# Patient Record
Sex: Female | Born: 1975 | Race: White | Hispanic: No | Marital: Married | Smoking: Never smoker
Health system: Southern US, Community
[De-identification: ages and names within clinical notes are randomized; demographics above are authoritative.]

## PROBLEM LIST (undated history)

## (undated) ENCOUNTER — Inpatient Hospital Stay (HOSPITAL_COMMUNITY): Payer: Self-pay

## (undated) DIAGNOSIS — O24419 Gestational diabetes mellitus in pregnancy, unspecified control: Secondary | ICD-10-CM

## (undated) DIAGNOSIS — O139 Gestational [pregnancy-induced] hypertension without significant proteinuria, unspecified trimester: Secondary | ICD-10-CM

## (undated) DIAGNOSIS — N83209 Unspecified ovarian cyst, unspecified side: Secondary | ICD-10-CM

## (undated) HISTORY — DX: Gestational diabetes mellitus in pregnancy, unspecified control: O24.419

## (undated) HISTORY — PX: LAPAROSCOPIC SALPINGOOPHERECTOMY: SUR795

## (undated) HISTORY — PX: LAPAROSCOPIC OVARIAN CYSTECTOMY: SUR786

---

## 1994-02-26 HISTORY — PX: LAPAROSCOPY: SHX197

## 1997-08-22 ENCOUNTER — Inpatient Hospital Stay (HOSPITAL_COMMUNITY): Admission: AD | Admit: 1997-08-22 | Discharge: 1997-08-25 | Payer: Self-pay | Admitting: Obstetrics and Gynecology

## 1997-09-20 ENCOUNTER — Other Ambulatory Visit: Admission: RE | Admit: 1997-09-20 | Discharge: 1997-09-20 | Payer: Self-pay | Admitting: Obstetrics and Gynecology

## 1999-10-25 ENCOUNTER — Encounter (INDEPENDENT_AMBULATORY_CARE_PROVIDER_SITE_OTHER): Payer: Self-pay

## 1999-10-25 ENCOUNTER — Ambulatory Visit (HOSPITAL_COMMUNITY): Admission: RE | Admit: 1999-10-25 | Discharge: 1999-10-25 | Payer: Self-pay | Admitting: Gynecology

## 2000-07-10 ENCOUNTER — Encounter: Admission: RE | Admit: 2000-07-10 | Discharge: 2000-10-08 | Payer: Self-pay | Admitting: Gynecology

## 2000-09-13 ENCOUNTER — Inpatient Hospital Stay (HOSPITAL_COMMUNITY): Admission: AD | Admit: 2000-09-13 | Discharge: 2000-09-15 | Payer: Self-pay | Admitting: *Deleted

## 2001-11-18 ENCOUNTER — Other Ambulatory Visit: Admission: RE | Admit: 2001-11-18 | Discharge: 2001-11-18 | Payer: Self-pay | Admitting: Gynecology

## 2002-12-09 ENCOUNTER — Other Ambulatory Visit: Admission: RE | Admit: 2002-12-09 | Discharge: 2002-12-09 | Payer: Self-pay | Admitting: Gynecology

## 2004-03-10 ENCOUNTER — Other Ambulatory Visit: Admission: RE | Admit: 2004-03-10 | Discharge: 2004-03-10 | Payer: Self-pay | Admitting: Gynecology

## 2004-12-19 ENCOUNTER — Other Ambulatory Visit: Admission: RE | Admit: 2004-12-19 | Discharge: 2004-12-19 | Payer: Self-pay | Admitting: Gynecology

## 2005-04-18 ENCOUNTER — Inpatient Hospital Stay (HOSPITAL_COMMUNITY): Admission: AD | Admit: 2005-04-18 | Discharge: 2005-04-18 | Payer: Self-pay | Admitting: Gynecology

## 2005-06-24 ENCOUNTER — Inpatient Hospital Stay (HOSPITAL_COMMUNITY): Admission: AD | Admit: 2005-06-24 | Discharge: 2005-06-26 | Payer: Self-pay | Admitting: Gynecology

## 2005-08-22 ENCOUNTER — Other Ambulatory Visit: Admission: RE | Admit: 2005-08-22 | Discharge: 2005-08-22 | Payer: Self-pay | Admitting: Gynecology

## 2007-01-21 ENCOUNTER — Other Ambulatory Visit: Admission: RE | Admit: 2007-01-21 | Discharge: 2007-01-21 | Payer: Self-pay | Admitting: Gynecology

## 2007-01-27 ENCOUNTER — Ambulatory Visit (HOSPITAL_BASED_OUTPATIENT_CLINIC_OR_DEPARTMENT_OTHER): Admission: RE | Admit: 2007-01-27 | Discharge: 2007-01-27 | Payer: Self-pay | Admitting: Gynecology

## 2007-01-27 ENCOUNTER — Encounter: Payer: Self-pay | Admitting: Gynecology

## 2007-09-13 ENCOUNTER — Inpatient Hospital Stay (HOSPITAL_COMMUNITY): Admission: AD | Admit: 2007-09-13 | Discharge: 2007-09-13 | Payer: Self-pay | Admitting: Obstetrics & Gynecology

## 2008-02-11 ENCOUNTER — Inpatient Hospital Stay (HOSPITAL_COMMUNITY): Admission: AD | Admit: 2008-02-11 | Discharge: 2008-02-13 | Payer: Self-pay | Admitting: Obstetrics and Gynecology

## 2009-03-31 ENCOUNTER — Other Ambulatory Visit: Admission: RE | Admit: 2009-03-31 | Discharge: 2009-03-31 | Payer: Self-pay | Admitting: Gynecology

## 2009-03-31 ENCOUNTER — Ambulatory Visit: Payer: Self-pay | Admitting: Women's Health

## 2010-06-12 ENCOUNTER — Other Ambulatory Visit: Payer: Self-pay | Admitting: Gynecology

## 2010-06-12 ENCOUNTER — Encounter (INDEPENDENT_AMBULATORY_CARE_PROVIDER_SITE_OTHER): Payer: BLUE CROSS/BLUE SHIELD | Admitting: Gynecology

## 2010-06-12 ENCOUNTER — Other Ambulatory Visit (HOSPITAL_COMMUNITY)
Admission: RE | Admit: 2010-06-12 | Discharge: 2010-06-12 | Disposition: A | Discharge status: Home / Self Care | Payer: BLUE CROSS/BLUE SHIELD | Source: Ambulatory Visit | Attending: Gynecology | Admitting: Gynecology

## 2010-06-12 DIAGNOSIS — Z124 Encounter for screening for malignant neoplasm of cervix: Secondary | ICD-10-CM | POA: Insufficient documentation

## 2010-06-12 DIAGNOSIS — R82998 Other abnormal findings in urine: Secondary | ICD-10-CM

## 2010-06-12 DIAGNOSIS — R635 Abnormal weight gain: Secondary | ICD-10-CM

## 2010-06-12 DIAGNOSIS — B373 Candidiasis of vulva and vagina: Secondary | ICD-10-CM

## 2010-06-12 DIAGNOSIS — Z01419 Encounter for gynecological examination (general) (routine) without abnormal findings: Secondary | ICD-10-CM

## 2010-06-12 DIAGNOSIS — Z13220 Encounter for screening for lipoid disorders: Secondary | ICD-10-CM

## 2010-06-12 DIAGNOSIS — Z833 Family history of diabetes mellitus: Secondary | ICD-10-CM

## 2010-06-12 DIAGNOSIS — N831 Corpus luteum cyst of ovary, unspecified side: Secondary | ICD-10-CM

## 2010-06-12 DIAGNOSIS — R5381 Other malaise: Secondary | ICD-10-CM

## 2010-06-12 DIAGNOSIS — N800 Endometriosis of uterus: Secondary | ICD-10-CM

## 2010-06-12 DIAGNOSIS — N949 Unspecified condition associated with female genital organs and menstrual cycle: Secondary | ICD-10-CM

## 2010-06-12 DIAGNOSIS — N898 Other specified noninflammatory disorders of vagina: Secondary | ICD-10-CM

## 2010-06-21 ENCOUNTER — Encounter: Payer: Self-pay | Admitting: Women's Health

## 2010-07-05 ENCOUNTER — Other Ambulatory Visit: Payer: BLUE CROSS/BLUE SHIELD

## 2010-07-10 ENCOUNTER — Other Ambulatory Visit: Payer: Self-pay | Admitting: Gynecology

## 2010-07-10 DIAGNOSIS — Z1231 Encounter for screening mammogram for malignant neoplasm of breast: Secondary | ICD-10-CM

## 2010-07-11 NOTE — H&P (Signed)
NAMESHAMONE, WINZER             ACCOUNT NO.:  [id number]   MEDICAL RECORD NO.:  [id number]          PATIENT TYPE:  AMB   LOCATION:  [facility]                         FACILITY:  [facility]   PHYSICIAN:  Juan H. Lily Peer, M.D.DATE OF BIRTH:  [birth year]   DATE OF ADMISSION:  DATE OF DISCHARGE:                              HISTORY & PHYSICAL   The patient is scheduled for surgery on Monday, January 27, 2007, at  1:00 p.m. at [facility].   CHIEF COMPLAINT:  1. Chronic left lower quadrant pain.  2. Ovarian cyst.  3. History of endometriosis.   HISTORY OF PRESENT ILLNESS:  The patient is a 35 year old gravida 4,  para 4, with chronic left lower quadrant pain worse through the course  of this year.  Her history has been significant that she had a prior  laparoscopy in [place] due to her left lower quadrant pain and underwent an  ovarian cystectomy and endometriosis was identified and was treated with  laser in [place], [place], in 2001.  Because of pelvic pain  and a right ovarian cyst, she underwent a diagnostic laparoscopy with a  right ovarian cystectomy and treatment of endometriosis as well.  The  patient has a Mirena IUD for contraception and over the course of this  year, her left lower quadrant pain and dyspareunia has been increasing  on a monthly basis.  She was initially scheduled to undergo laparoscopic  ovarian cystectomy on the left ovary with the possible left salpingo-  oophorectomy.  She had a 4-cm left ovarian cyst and right before  surgery, she returned for follow up and had been in resolution of the  cystic collapse to a size of 1.8 x 1.6 cm.  The patient was feeling  better and she cancelled her surgery.  Over the past three months her  pain has intensified and she would like to proceed with definitive  treatment.  She did have an ultrasound in the office on November 29, 2006,  which demonstrated she had a normal position uterus of the IUD in place.  She had a thin-walled solid mass with diffuse homogeneous low-level  echoes, measuring 15 x 10 x 12 mm, negative color flow Doppler on the  right.  Her left ovary had several follicles, but she had a thick wall  cystic mass measuring 26 x 25 x 27 mm with positive color-flow Doppler  at the perimeter of the mass with internal low-level echoes.  The  patient is scheduled to undergo diagnostic laparoscopy with left  oophorectomy, possible left salpingo-oophorectomy, lysis of pelvic  adhesions, and laser treatment of endometriotic implants.  She was seen  in the office for preoperative consultation on January 21, 2007.   PAST MEDICAL HISTORY:  She is allergic to codeine.  She had surgeries  consisted of laparoscopy in 1996 and in 2001 for endometriosis and  ovarian cystectomies.  She had four vaginal deliveries.  She did have  gestational diabetes with her pregnancy.  She currently has a Mirena  IUD.   FAMILY HISTORY:  Significant for diabetes in paternal grandmother,  hypertension in her brother,  and father with a history of bone cancer.   PHYSICAL EXAMINATION:  VITAL SIGNS:  The patient weighs 144 pounds.  She  is 5 feet and 8 inches tall.  Blood pressure is 110/70.  HEENT:  Unremarkable.  NECK:  Supple.  Trachea midline.  No carotid bruits or thyromegaly.  LUNGS:  Clear to auscultation.  No rhonchi or wheezes.  HEART: Regular rate and rhythm.  No murmurs or gallop.  BREAST EXAM:  Both breasts are symmetrical in appearance with no  palpable mass or tenderness.  No supraclavicular or axillary  lymphadenopathy.  ABDOMEN:  Soft and nontender.  No rebound or guarding.  PELVIC:  Bartholin, urethra, and Skene's glands are within normal  limits.  VAGINA:  She was tender in the rectovaginal region and was confirmed on  bimanual examination and especially exquisitely tender in her left lower  quadrant with questionable slight fullness.  RECTAL EXAM:  Confirmatory   ASSESSMENT:  A  35 year old gravida 4, para 4, with a Marine IUD in  place, chronic left lower quadrant pain, past history of laparoscopy x2  for endometriosis and ovarian cystectomies in both ovaries.  The patient  with debilitating pain monthly as well as dyspareunia would like to  proceed with definitive surgery, but would like to maintain her  fertility in the event that she changes her mind in the future and wants  to have another child, but she has consented to proceeding with a left  salpingo-oophorectomy, pelvic adhesiolysis, and laser treatment of  endometriotic implant.  The patient will be placed on a bowel prep.  The  risk and benefits and pros and cons of the operation were discussed  including infection, although, she will receive prophylactic antibiotic.  The risk of deep venous thrombosis and subsequent pulmonary embolism  were discussed.  She will have PSA stocking.  We also discussed in the  event of any technical difficulty or trauma occurring laparoscopically  due to the fact that she has had surgeries in the past and that an open  laparotomy technique may needed to be utilized to gain entrance into the  abdominal cavity and complete the operation.  Risk of also of trauma and  injury to the bowel, intestines, blood vessels, and other internal  structures were discussed, requiring immediate corrective surgery at  that point.  The patient has extensive endometriosis.  We will proceed  with just left salpingo-oophorectomy and sit down with her plan on the  later dates proceed in with a total hysterectomy if indicated.  All  these issues were discussed with the patient's.  All questions were  answered and will follow accordingly.   PLAN:  The patient scheduled for:  1. Diagnostic laparoscopy.  2. Left salpingo-oophorectomy.  3. Possible lysis of pelvic adhesions and treatment of endometriotic      implant on Monday, January 27, 2007 at 1:00 p.m. at [facility].   Please have history and history and physical      available.      Juan H. Lily Peer, M.D.  Electronically Signed     JHF/MEDQ  D:  01/23/2007  T:  01/24/2007  Job:  [id number]

## 2010-07-11 NOTE — Op Note (Signed)
NAMELEXIS, Chelsea Livingston             ACCOUNT NO.:  [id number]   MEDICAL RECORD NO.:  [id number]          PATIENT TYPE:  AMB   LOCATION:  [facility]                         FACILITY[facility]   PHYSICIAN:  Juan H. Lily Peer, M.D.DATE OF BIRTH:  October 08, 1975   DATE OF PROCEDURE:  DATE OF DISCHARGE:                               OPERATIVE REPORT   FIRST ASSISTANT:  Timothy P. Fontaine, M.D.   INDICATIONS FOR OPERATION:  A 35 year old gravida 4, para 4, with  chronic left lower quadrant pain, history of endometriosis, laparoscopy  in the past x2.  Prior history of right ovarian cystectomy as well.   PREOPERATIVE DIAGNOSIS:  1. Chronic left lower quadrant pain.  2. Ovarian cyst.  3. History of endometriosis.   POSTOPERATIVE DIAGNOSIS:  1. Pelvic adhesions.  2. Severe endometriosis.  3. Right ovarian cyst.   PROCEDURES PERFORMED:  1. Diagnostic laparoscopy.  2. Lysis of pelvic adhesions.  3. Left salpingo-oophorectomy.  4. Excision of right paratubal cyst.  5. Drainage of right ovarian cyst.  6. Ablation of endometriotic implants.   ANESTHESIA:  General endotracheal anesthesia.   FINDINGS:  1. Severe endometriosis, especially of the left ovary and cul-de-sac,      posterior and anterior cul-de-sac.  2. Right paratubal cyst.  3. Pelvic adhesions, adhesive band was found to be adhered from the      sigmoid colon to the left ovary.  4. Right ovarian cyst.   DESCRIPTION OF OPERATION:  After the patient was adequately counseled,  she was taken to the operating room where she underwent a successful  general endotracheal anesthesia.  PSA stockings were utilized for DVT  prophylaxis.  She also received a gram of cephalexin for prophylaxis as  well.  She was placed in low lithotomy position.  The vagina was  cleansed with Betadine solution as was her abdomen.  A red rubber  Roxan Hockey was inserted in the bladder to evacuate the bladder of its  contents and a ring sponge forceps was placed in  the vagina for  manipulation of the cervix and uterus to the fact that the patient has a  Marine IUD.  Once the drapes were in place, a small stab incision was  made underneath the umbilicus.  The Optiview 10/11 trocar with  laparoscope attached was utilized to gain entrance into the abdominal  pelvic cavity under direct visualization.  Once this was accomplished,  pneumoperitoneum was accomplished, whereby 2.5-3 liters of carbon  dioxide were insufflated into the pelvic cavity.  The patient was then  placed in a Trendelenburg position.  Two additional lower abdominal  lateral ports with 5-mm trocars were inserted under laparoscopic  guidance.  A systematic inspection had demonstrated that she had an  adhesive band from the sigmoid colon to the left tube and ovary.  The  left tube and ovary was found to be adhered to left pelvic sidewall,  friable on contact with evidence of endometriotic implants around the  ovary as well as left pelvic sidewall, as well as several islets of  endometriosis was noted in the cul-de-sac and underneath the surface of  the right  ovary.  Also there was a right paratubal cyst with an adhesive  band noted as well from the back of the uterus to the right ovary and a  simple 2-cm right ovarian cyst was noted with no excrescences.  With the  use of the harmonic scalpel, the adhesive band from the sigmoid colon to  the left tube and ovary was transected.  Identification of the left  ureter was identified after further adhesiolysis to free the left tube  and ovary from the left side wall.  The left infundibulopelvic ligament  was transected was transected with utilization of the harmonic scalpel  placed on a low-frequency with an effort to complete the coaptation of  the left infundibulopelvic ligament.  With continuation with the use of  the harmonic scalpel, the utero-ovarian ligament was transected in a  similar fashion and the mesosalpinx was transected in a  similar fashion  to free the left tube and ovary, and it was placed in the cul-de-sac.  Attention was then placed to the right ovary, whereby a paratubal cyst  was evident and adhered to the posterior uterine wall, which was  transected in similar fashion with the harmonic scalpel and a  representation was submitted to pathology after it was pulled out with a  5-mm port.  The small less than 2-cm simple ovarian cyst was incised  with a back blade of the harmonic scalpel in a high frequency to incise  and drained a clear fluid and cyst.  At this point, the 5-mm  hysteroscope was inserted to the left 5-mm port and the laparoscope was  removed from the 10/11 port followed by insertion of the laparoscopic  Endopouch and the left tube and ovary were retrieved and passed off the  operative field for histological evaluation.  A 5-mm hysteroscope was  removed and once again a 10/11 operative scope was inserted.  The pelvic  cavity was copiously irrigated with normal saline solution.  The several  islets of endometriotic implants that were noted especially in the  posterior cul-de-sac and few scattered islets on the anterior cul-de-  sac, were treated with the harmonic scalpel with a flat wide edge  individually on a high-frequency mode.  This area also was coapted  especially underneath the surface of the right ovary as well.  Once this  was accomplished, the pelvic cavity was once again copiously irrigated  with normal saline solution.  Interceed was placed in the posterior cul-  de-sac for adhesion prevention.  The pneumoperitoneum was removed.  The  instruments were removed under laparoscopic guidance.  The umbilical  fascial incision was closed with two interrupted stitches of 0 Vicryl  suture and the skin was reapproximated with a subcuticular stitch of 4-0  plain catgut suture.  The 2/5 mm ports were closed with interrupted  sutures of 4-0 plain catgut suture and for postoperative  analgesia,  0.25% Marcaine was infiltrated subcutaneously into each other port site  for postoperative analgesia for total of 10 mL.  The patient was  extubated and transferred to recovery room with stable vital signs.  Blood loss was minimal and fluid resuscitation consisted 1200 mL of  lactated Ringer's.      Juan H. Lily Peer, M.D.  Electronically Signed     JHF/MEDQ  D:  01/27/2007  T:  01/28/2007  Job:  [id number]

## 2010-07-14 NOTE — Discharge Summary (Signed)
[facility]  Patient:    Chelsea Livingston, CLINKSCALES Visit Number: [id number] MRN: [id number]          Service Type: OBS Location: 910A [id number] [id number] Attending Physician:  Wetzel Bjornstad Dictated by:   Antony Contras, N.P. Admit Date:  09/13/2000 Discharge Date: 09/15/2000                             Discharge Summary  DISCHARGE DIAGNOSES:          Intrauterine pregnancy at term, gestational diabetes, history of macrosomic infants x 2, spontaneous rupture of membranes.  PROCEDURE:                    Normal spontaneous vaginal delivery of viable infant, repair of first degree laceration.  HISTORY OF PRESENT ILLNESS:   The patient is a 35 year old gravida 3, para 2-0-0-2 with LMP December 25, 1999, [facility] June 01, 2000.  Prenatal risk factors include history of gestational diabetes which is diet controlled.  PRENATAL LABORATORIES:        Blood type A+.  Antibody screen negative.  RPR, HBSAG, HIV nonreactive.  Rubella immune.  MSAFP normal.  HOSPITAL COURSE:              Patient had been scheduled to undergo induction of labor secondary to her diabetes and history of previous macrosomic infants at 2 1/2 weeks.  She did present on September 13, 2000 with spontaneous rupture of membranes.  Cervix was 1, 50%, -2 station.  She did progress to complete dilatation.  Did have some variable decelerations during labor, but delivered an Apgar 9/9 female infant over an intact perineum with a first degree laceration.  Infants weight was 7 pounds 15 ounces.  Postpartum course she remained afebrile.  Had no difficulty with voiding.  Was able to be discharged on her second postpartum day.  LABORATORIES:                 CBC:  Hematocrit 31, hemoglobin 10.3, WBC 9.8, platelets 171,000.  DISPOSITION:                  Follow-up in six weeks.  Continue prenatal vitamins and iron, Motrin and Tylox for pain. Dictated by:   Antony Contras, N.P. Attending Physician:  Wetzel Bjornstad DD:  10/25/00 TD:  10/25/00 Job: [id number]

## 2010-07-14 NOTE — Op Note (Signed)
[facility]  Patient:    Chelsea Livingston, Chelsea Livingston                      MRN: [id number] Proc. Date: 10/25/99 Adm. Date:  [id number] Attending:  Douglass Rivers                           Operative Report  PREOPERATIVE DIAGNOSES:       1. Pelvic pain.                               2. Right ovarian cyst.                               3. History of endometriosis.  POSTOPERATIVE DIAGNOSES:      1. Pelvic pain.                               2. Right ovarian cyst.                               3. History of endometriosis.                               4. Endometriosis.  OPERATION:                    Right ovarian cystectomy and lysis of adhesions.                               Destruction of endometriosis.  SURGEON:                      Douglass Rivers, M.D.  ASSISTANT:                    Katy Fitch, M.D.  ANESTHESIA:                   General anesthesia.  ESTIMATED BLOOD LOSS:         Minimal.  FINDINGS:                     A right ovarian cyst with clear fluid.  The left ovary was densely scarred to the sidewall with some endometriosis on the inferior aspect.  Normal uterus, tubes, liver and gallbladder.  SPECIMEN:                     Cyst wall.  COMPLICATIONS:                None.  DESCRIPTION OF PROCEDURE:     The patient was taken to the operating room where general anesthesia was introduced.  The patient was placed in the dorsal lithotomy position and prepped and draped in the usual sterile fashion.  A bivalve speculum was placed in the vagina and the cervix was visualized and the Hulka uterine manipulator was placed.  Attention was then turned to the abdomen. An infraumbilical incision was made with the scalpel. The Verres needle was inserted.  Opening pressure was -4.  Pneumoperitoneum was created. Tympany was appreciated by  the liver.  A #10 trocar was then inserted through the port.  Inspection of the pelvis was as above.  A suprapubic #5 port  was placed under direct visualization and a #5 left lateral port was placed under direct visualization.  The right ovary was noted to have a cyst and was freely mobile. The left ovary appeared normal and small but was not mobile. The tubo-ovarian ligament was elevated on the right.  The ovary was stabilized. The cyst was incised with scissors and the extended. The ovarian wall was then grasped with a single-tooth grasper. The cyst wall was then gently peeled off in multiple bites and was sent for permanent.  The base of the cyst was noted to be hemostatic.  The pelvis was irrigated with copious amounts of fluid. Attention was then turned toward the left ovary and poorly seen initially.  A blunt probe was then placed underneath the ovary in an attempt to better visualize it but was poorly seen.  Better inspection on the inferior side of the ovary showed that it was densely adherent to the sidewall and there was a small area of endometriosis and fibrosis of the ovary.  The ovary was then dissected off the sidewall by blunt and sharp dissection.  Some bowel adhesions that were adherent to the left sidewall were also extended _________ were also sharply dissected off. The ovary was elevated and a small rent in the peritoneum was created.  A pneumoperitoneum was used to heterodoxies the ovary free of the sidewall.  The small area of endometriosis had been treated with the Klepinger. The ureter had been identified prior to this and was noted to be free of the ovary in the area of adhesions. Peristalsis was seen.  The ovary was then elevated. The piece of __________ was placed underneath the ovary in order to prevent further adhesions.  It was noted to be hemostatic. The instruments were then removed from the abdomen. Pneumoperitoneum was released. The camera was removed under direct visualization. The infraumbilical port fascia was closed with 0 Vicryl with figure-of-eight sutures. The skin was  closed with 4-0 plain and the port was injected with 0.25 Marcaine.  The #5 ports were injected with 0.25% Marcaine and closed with Tincture of benzoin and Steri-Strips. DD:  10/25/99 TD:  10/26/99 Job: [id number]

## 2010-07-14 NOTE — H&P (Signed)
Chelsea Livingston, Chelsea Livingston             ACCOUNT NO.:  [id number]   MEDICAL RECORD NO.:  [id number]          PATIENT TYPE:  INP   LOCATION:  9173                          FACILITY:  [facility]   PHYSICIAN:  Timothy P. Fontaine, M.D.DATE OF BIRTH:  Jun 27, 1975   DATE OF ADMISSION:  06/24/2005  DATE OF DISCHARGE:                                HISTORY & PHYSICAL   CHIEF COMPLAINT:  Pregnancy at 37 weeks, for elective induction.   HISTORY OF PRESENT ILLNESS:  [age]-year-old G4, P3 female at 103 weeks'  gestation, admitted for serial elective induction per Dr. Reynaldo Minium.  The patient's prenatal course per her [name] has been uncomplicated to  date with a prior history of gestational diabetes with a prior pregnancy as  well as prior history of macrosomia, delivering at 35 and 38 weeks with 9-  pound infants.  The patient's beta strep status is negative.  Blood type is  A-positive.  Rubella titer is positive.  For the rest of her history, see  her Hollister.   PHYSICAL EXAMINATION:  HEENT:  Normal.  LUNGS:  Clear.  CARDIAC:  Regular rate without rubs, murmurs or gallops.  ABDOMEN:  Gravid vertex consistent with term.  External monitors:  Reactive  fetal tracing with irregular contractions.  PELVIC:  Per admission nursing personnel, the patient was 1-cm dilated,  vertex presentation.   ASSESSMENT AND PLAN:  [age]-year-old gravida 4, para 3 female with  history of prior fetal macrosomia at 32 and 38 weeks, admitted for elective  induction by Dr. Reynaldo Minium.  We will plan on Cervidil induction in the  evening with Pitocin in the morning.  She is beta-strep-negative.      Timothy P. Fontaine, M.D.  Electronically Signed     TPF/MEDQ  D:  06/25/2005  T:  06/25/2005  Job:  [id number]

## 2010-07-14 NOTE — H&P (Signed)
NAMEBari Livingston             ACCOUNT NO.:  [id number]   MEDICAL RECORD NO.:  [id number]          PATIENT TYPE:  INP   LOCATION:  9127                          FACILITY:  [facility]   PHYSICIAN:  Juan H. Lily Peer, M.D.DATE OF BIRTH:  1975/09/04   DATE OF ADMISSION:  06/24/2005  DATE OF DISCHARGE:                                HISTORY & PHYSICAL   CHIEF COMPLAINT:  1.  Term intrauterine pregnancy.  2.  Elective induction.   HISTORY:  The patient is a 35 year old gravida 4, para 3 with a last  menstrual period of October 06, 2004.  Patient will be 37-1/[redacted] weeks gestation  at time she is admitted to [facility] for elective induction.  Patient  has had three prior deliveries.  The first two were 9-1/2 and 9.9 pounds,  respectively at 38 and [redacted] weeks gestation.  The last child delivered in 2002  weighed 7 pounds 15 ounces at 37 weeks.  Patient had a normal hemoglobin A1c  early this pregnancy.  Also had a normal diabetes screen.  She had declined  first trimester screening, cystic fibrosis screening, but opted to proceed  with a maternal alpha fetoprotein and screening ultrasound at 18-[redacted] weeks  gestation which were all normal.  Patient had an uneventful prenatal course  with the exception of a urinary tract infection and has elected to proceed  with an elective induction.  GBS culture was done on April 23 and results  pending at time of this dictation.   PAST MEDICAL HISTORY:  Obstetrical history as described above.  Patient  denies any allergies.  She had gestational diabetes with previous pregnancy,  diet control.   REVIEW OF SYSTEMS:  See Hollister form.   PHYSICAL EXAMINATION:  GENERAL:  Well-developed, well-nourished female.  HEENT:  Unremarkable.  NECK:  Supple.  Trachea midline.  No carotid bruits.  No thyromegaly.  LUNGS:  Clear to auscultation without any rhonchi or wheezes.  HEART:  Regular rate and rhythm.  No murmurs or gallops.  BREASTS:  Not done.  ABDOMEN:   Gravid uterus.  Vertex presentation by [facility] maneuver.  PELVIC:  Cervix fingertip, 50% effaced, and -3 station.  EXTREMITIES:  DTRs.  No clonus.  Trace edema.   PRENATAL LABORATORIES:  A+ blood type.  Negative antibody screen.  VDRL was  nonreactive.  Rubella immune.  Hepatitis B surface antigen and HIV were  negative.  Patient declined cystic fibrosis screening.  Declined first  trimester screen.  Declined maternal serum alpha fetoprotein.  Diabetes  screen was normal.  GBS culture on April 23 was normal.   ASSESSMENT:  35 year old gravida 4, para 3 at 37-1/[redacted] weeks gestation  requesting elective induction.  Size and dates have all coincided throughout  her pregnancy.  She has prior history of fetal macrosomia.  Had normal  hemoglobin A1c as well as diabetes screen with this pregnancy.  Recent  ultrasound on April 24 demonstrated a fetus consistent with [redacted] weeks  gestation at 6 pounds 9 ounces in the 54th percentile for [redacted] weeks  gestation, normal AFI and vertex presentation.  The patient with negative  GBS culture.  The patient will be admitted on Sunday, April 29  for cervical ripening with Cervidil intravaginally for 10-12 hours followed  by Pitocin induction the following morning and artificial rupture of  membranes.  Anticipate vaginal delivery.   PLAN:  As per assessment above.      Juan H. Lily Peer, M.D.  Electronically Signed     JHF/MEDQ  D:  06/21/2005  T:  06/21/2005  Job:  [id number]

## 2010-07-18 ENCOUNTER — Ambulatory Visit: Payer: BLUE CROSS/BLUE SHIELD

## 2010-07-19 ENCOUNTER — Ambulatory Visit
Admission: RE | Admit: 2010-07-19 | Discharge: 2010-07-19 | Disposition: A | Discharge status: Home / Self Care | Payer: BLUE CROSS/BLUE SHIELD | Source: Ambulatory Visit | Attending: Gynecology | Admitting: Gynecology

## 2010-07-19 ENCOUNTER — Other Ambulatory Visit: Payer: Self-pay | Admitting: Gastroenterology

## 2010-07-19 DIAGNOSIS — Z1231 Encounter for screening mammogram for malignant neoplasm of breast: Secondary | ICD-10-CM

## 2010-07-25 ENCOUNTER — Ambulatory Visit
Admission: RE | Admit: 2010-07-25 | Discharge: 2010-07-25 | Disposition: A | Discharge status: Home / Self Care | Payer: BLUE CROSS/BLUE SHIELD | Source: Ambulatory Visit | Attending: Gastroenterology | Admitting: Gastroenterology

## 2010-07-25 MED ORDER — IOHEXOL 300 MG/ML  SOLN
100.0000 mL | Freq: Once | INTRAMUSCULAR | Status: AC | PRN
  Administered 2010-07-25: 100 mL via INTRAVENOUS

## 2010-09-01 ENCOUNTER — Encounter: Payer: Self-pay | Admitting: *Deleted

## 2010-11-24 LAB — CBC
HCT: 39.6
Hemoglobin: 13.7
MCHC: 34.7
RBC: 4.71
RDW: 15.2

## 2010-11-24 LAB — DIFFERENTIAL
Eosinophils Relative: 1
Lymphs Abs: 1.7
Neutro Abs: 7.1
Neutrophils Relative %: 75

## 2010-11-24 LAB — URINALYSIS, ROUTINE W REFLEX MICROSCOPIC
Bilirubin Urine: NEGATIVE
Glucose, UA: NEGATIVE
Ketones, ur: NEGATIVE
Specific Gravity, Urine: 1.01

## 2010-11-30 LAB — CBC
Hemoglobin: 10.9 g/dL — ABNORMAL LOW (ref 12.0–15.0)
MCV: 80.2 fL (ref 78.0–100.0)
Platelets: 191 10*3/uL (ref 150–400)
RBC: 4.05 MIL/uL (ref 3.87–5.11)
RDW: 14.8 % (ref 11.5–15.5)
WBC: 10 10*3/uL (ref 4.0–10.5)

## 2010-11-30 LAB — RPR: RPR Ser Ql: NONREACTIVE

## 2011-02-01 ENCOUNTER — Other Ambulatory Visit: Payer: Self-pay | Admitting: Obstetrics and Gynecology

## 2011-02-01 LAB — OB RESULTS CONSOLE GC/CHLAMYDIA
Chlamydia: NEGATIVE
Gonorrhea: NEGATIVE

## 2011-02-01 LAB — OB RESULTS CONSOLE ANTIBODY SCREEN: Antibody Screen: NEGATIVE

## 2011-02-01 LAB — OB RESULTS CONSOLE ABO/RH

## 2011-02-27 NOTE — L&D Delivery Note (Signed)
Delivery Note At 5:16 PM a viable and healthy female was delivered via Vaginal, Spontaneous Delivery (Presentation: Left  Anterior).  APGAR: 8, ; weight 7 lb 10.9 oz (3485 g).   Placenta status: Intact, Spontaneous.  Cord: 3 vessels   Anesthesia: Epidural  Episiotomy: None Lacerations: 1st degree Suture Repair: 3.0 chromic Est. Blood Loss (mL): <400  Mom to postpartum.  Baby to nursery-stable.  Mickel Baas 08/24/2011, 6:44 PM

## 2011-06-22 ENCOUNTER — Encounter (HOSPITAL_COMMUNITY): Payer: Self-pay

## 2011-06-22 ENCOUNTER — Inpatient Hospital Stay (HOSPITAL_COMMUNITY)
Admission: AD | Admit: 2011-06-22 | Discharge: 2011-06-22 | Disposition: A | Discharge status: Home / Self Care | Payer: Medicaid Other | Source: Ambulatory Visit | Attending: Obstetrics & Gynecology | Admitting: Obstetrics & Gynecology

## 2011-06-22 DIAGNOSIS — O479 False labor, unspecified: Secondary | ICD-10-CM

## 2011-06-22 DIAGNOSIS — O4700 False labor before 37 completed weeks of gestation, unspecified trimester: Secondary | ICD-10-CM | POA: Insufficient documentation

## 2011-06-22 HISTORY — DX: Gestational (pregnancy-induced) hypertension without significant proteinuria, unspecified trimester: O13.9

## 2011-06-22 HISTORY — DX: Unspecified ovarian cyst, unspecified side: N83.209

## 2011-06-22 HISTORY — DX: Gestational diabetes mellitus in pregnancy, unspecified control: O24.419

## 2011-06-22 NOTE — MAU Note (Signed)
Sent from office for PTL eval.  Mild pain on LLQ.

## 2011-06-22 NOTE — MAU Provider Note (Signed)
  History     CSN: [id number]  Arrival date and time: 06/22/11 1044   First Provider Initiated Contact with Patient 06/22/11 1152      Chief Complaint  Patient presents with  . Labor Eval   HPI 36 y.o. [id number] at [redacted]w[redacted]d sent from office today for observation for preterm contractions. Pt states she is feeling some cramping/mild discomfort for about 2 days. No bleeding or LOF. In office cervix was 1 cm/firm, FFN collected and brought to MAU.    Past Medical History  Diagnosis Date  . Endometriosis   . Normal spontaneous vaginal delivery     5  . GDM (gestational diabetes mellitus)     #3  . Gestational diabetes   . Pregnancy induced hypertension     #2  . Ovarian cyst   . Endometriosis     Past Surgical History  Procedure Date  . Laparoscopy 1996  . Laparoscopic ovarian cystectomy     left  . Laparoscopic salpingoopherectomy     with ablation. LSO    Family History  Problem Relation Age of Onset  . Hypertension Father   . Bone cancer Father   . Diabetes Paternal Grandmother   . Anesthesia problems Neg Hx     History  Substance Use Topics  . Smoking status: Never Smoker   . Smokeless tobacco: Not on file  . Alcohol Use: No    Allergies:  Allergies  Allergen Reactions  . Codeine     Prescriptions prior to admission  Medication Sig Dispense Refill  . medroxyPROGESTERone (DEPO-PROVERA) 150 MG/ML injection Inject 150 mg into the muscle every 3 (three) months.          Review of Systems  Constitutional: Negative.   Respiratory: Negative.   Cardiovascular: Negative.   Gastrointestinal: Negative for nausea, vomiting, abdominal pain, diarrhea and constipation.  Genitourinary: Negative for dysuria, urgency, frequency, hematuria and flank pain.       Negative for vaginal bleeding, vaginal discharge, + cramping   Musculoskeletal: Negative.   Neurological: Negative.   Psychiatric/Behavioral: Negative.    Physical Exam   Blood pressure 130/86, pulse 93,  temperature 97.4 F (36.3 C), temperature source Oral, resp. rate 16, height 5\' 7"  (1.702 m), weight 189 lb 12.8 oz (86.093 kg), SpO2 100.00%.  Physical Exam  Nursing note and vitals reviewed. Constitutional: She is oriented to person, place, and time. She appears well-developed and well-nourished. No distress.  Cardiovascular: Normal rate.   Respiratory: Effort normal.  GI: Soft. There is no tenderness.  Musculoskeletal: Normal range of motion.  Neurological: She is alert and oriented to person, place, and time.  Skin: Skin is warm and dry.  Psychiatric: She has a normal mood and affect.   EFM reactive, TOCO infrequent/irregular UCs   MAU Course  Procedures  Results for orders placed during the hospital encounter of 06/22/11 (from the past 24 hour(s))  FETAL FIBRONECTIN     Status: Normal   Collection Time   06/22/11 10:40 AM      Component Value Range   Fetal Fibronectin NEGATIVE  NEGATIVE      Assessment and Plan  36 y.o. [id number] at [redacted]w[redacted]d No evidence of preterm labor - d/c home with precautions F/U as scheduled  Wendy Mikles 06/22/2011, 11:53 AM

## 2011-06-22 NOTE — MAU Note (Signed)
Patient sent from the office for evaluation of preterm labor. FFN was done in the office and sent with the patient. Patient states come painless cramping, no bleeding or leaking. Cervix was 1cm and firm.

## 2011-07-07 ENCOUNTER — Encounter (HOSPITAL_COMMUNITY): Payer: Self-pay

## 2011-07-07 ENCOUNTER — Inpatient Hospital Stay (HOSPITAL_COMMUNITY)
Admission: AD | Admit: 2011-07-07 | Discharge: 2011-07-07 | Disposition: A | Discharge status: Home / Self Care | Payer: Medicaid Other | Source: Ambulatory Visit | Attending: Obstetrics and Gynecology | Admitting: Obstetrics and Gynecology

## 2011-07-07 DIAGNOSIS — L237 Allergic contact dermatitis due to plants, except food: Secondary | ICD-10-CM

## 2011-07-07 DIAGNOSIS — L255 Unspecified contact dermatitis due to plants, except food: Secondary | ICD-10-CM

## 2011-07-07 DIAGNOSIS — O99891 Other specified diseases and conditions complicating pregnancy: Secondary | ICD-10-CM | POA: Insufficient documentation

## 2011-07-07 LAB — GLUCOSE, CAPILLARY: Glucose-Capillary: 103 mg/dL — ABNORMAL HIGH (ref 70–99)

## 2011-07-07 MED ORDER — PREDNISONE (PAK) 10 MG PO TABS: 20.0000 mg | ORAL_TABLET | Freq: Every day | ORAL | Status: AC

## 2011-07-07 MED ORDER — FAMOTIDINE 20 MG PO TABS: 20.0000 mg | ORAL_TABLET | Freq: Two times a day (BID) | ORAL | Status: DC

## 2011-07-07 MED ORDER — PREDNISONE 50 MG PO TABS
50.0000 mg | ORAL_TABLET | Freq: Once | ORAL | Status: AC
  Administered 2011-07-07: 50 mg via ORAL
  Filled 2011-07-07: qty 1

## 2011-07-07 NOTE — MAU Note (Signed)
Patient presents with facial swelling, hands swelling and some in lower extremities went to urgent care and received injection was told has poison ivy told to take Benadryl at home took and feels no better G6P5 31 weeks

## 2011-07-07 NOTE — Discharge Instructions (Signed)
Contact Dermatitis Contact dermatitis is a reaction to certain substances that touch the skin. Contact dermatitis can be either irritant contact dermatitis or allergic contact dermatitis. Irritant contact dermatitis does not require previous exposure to the substance for a reaction to occur.Allergic contact dermatitis only occurs if you have been exposed to the substance before. Upon a repeat exposure, your body reacts to the substance.  CAUSES  Many substances can cause contact dermatitis. Irritant dermatitis is most commonly caused by repeated exposure to mildly irritating substances, such as:  Makeup.   Soaps.   Detergents.   Bleaches.   Acids.   Metal salts, such as nickel.  Allergic contact dermatitis is most commonly caused by exposure to:  Poisonous plants.   Chemicals (deodorants, shampoos).   Jewelry.   Latex.   Neomycin in triple antibiotic cream.   Preservatives in products, including clothing.  SYMPTOMS  The area of skin that is exposed may develop:  Dryness or flaking.   Redness.   Cracks.   Itching.   Pain or a burning sensation.   Blisters.  With allergic contact dermatitis, there may also be swelling in areas such as the eyelids, mouth, or genitals.  DIAGNOSIS  Your caregiver can usually tell what the problem is by doing a physical exam. In cases where the cause is uncertain and an allergic contact dermatitis is suspected, a patch skin test may be performed to help determine the cause of your dermatitis. TREATMENT Treatment includes protecting the skin from further contact with the irritating substance by avoiding that substance if possible. Barrier creams, powders, and gloves may be helpful. Your caregiver may also recommend:  Steroid creams or ointments applied 2 times daily. For best results, soak the rash area in cool water for 20 minutes. Then apply the medicine. Cover the area with a plastic wrap. You can store the steroid cream in the  refrigerator for a "chilly" effect on your rash. That may decrease itching. Oral steroid medicines may be needed in more severe cases.   Antibiotics or antibacterial ointments if a skin infection is present.   Antihistamine lotion or an antihistamine taken by mouth to ease itching.   Lubricants to keep moisture in your skin.   Burow's solution to reduce redness and soreness or to dry a weeping rash. Mix one packet or tablet of solution in 2 cups cool water. Dip a clean washcloth in the mixture, wring it out a bit, and put it on the affected area. Leave the cloth in place for 30 minutes. Do this as often as possible throughout the day.   Taking several cornstarch or baking soda baths daily if the area is too large to cover with a washcloth.  Harsh chemicals, such as alkalis or acids, can cause skin damage that is like a burn. You should flush your skin for 15 to 20 minutes with cold water after such an exposure. You should also seek immediate medical care after exposure. Bandages (dressings), antibiotics, and pain medicine may be needed for severely irritated skin.  HOME CARE INSTRUCTIONS  Avoid the substance that caused your reaction.   Keep the area of skin that is affected away from hot water, soap, sunlight, chemicals, acidic substances, or anything else that would irritate your skin.   Do not scratch the rash. Scratching may cause the rash to become infected.   You may take cool baths to help stop the itching.   Only take over-the-counter or prescription medicines as directed by your caregiver.     See your caregiver for follow-up care as directed to make sure your skin is healing properly.  SEEK MEDICAL CARE IF:   Your condition is not better after 3 days of treatment.   You seem to be getting worse.   You see signs of infection such as swelling, tenderness, redness, soreness, or warmth in the affected area.   You have any problems related to your medicines.  Document Released:  02/10/2000 Document Revised: 02/01/2011 Document Reviewed: 07/18/2010 Exit[employer] Patient Information 2012 [employer].Poison Ivy Poison ivy is a inflammation of the skin (contact dermatitis) caused by touching the allergens on the leaves of the ivy plant following previous exposure to the plant. The rash usually appears 48 hours after exposure. The rash is usually bumps (papules) or blisters (vesicles) in a linear pattern. Depending on your own sensitivity, the rash may simply cause redness and itching, or it may also progress to blisters which may break open. These must be well cared for to prevent secondary bacterial (germ) infection, followed by scarring. Keep any open areas dry, clean, dressed, and covered with an antibacterial ointment if needed. The eyes may also get puffy. The puffiness is worst in the morning and gets better as the day progresses. This dermatitis usually heals without scarring, within 2 to 3 weeks without treatment. HOME CARE INSTRUCTIONS  Thoroughly wash with soap and water as soon as you have been exposed to poison ivy. You have about one half hour to remove the plant resin before it will cause the rash. This washing will destroy the oil or antigen on the skin that is causing, or will cause, the rash. Be sure to wash under your fingernails as any plant resin there will continue to spread the rash. Do not rub skin vigorously when washing affected area. Poison ivy cannot spread if no oil from the plant remains on your body. A rash that has progressed to weeping sores will not spread the rash unless you have not washed thoroughly. It is also important to wash any clothes you have been wearing as these may carry active allergens. The rash will return if you wear the unwashed clothing, even several days later. Avoidance of the plant in the future is the best measure. Poison ivy plant can be recognized by the number of leaves. Generally, poison ivy has three leaves with flowering branches  on a single stem. Diphenhydramine may be purchased over the counter and used as needed for itching. Do not drive with this medication if it makes you drowsy.Ask your caregiver about medication for children. SEEK MEDICAL CARE IF:  Open sores develop.   Redness spreads beyond area of rash.   You notice purulent (pus-like) discharge.   You have increased pain.   Other signs of infection develop (such as fever).  Document Released: 02/10/2000 Document Revised: 02/01/2011 Document Reviewed: 12/29/2008 [employer] Patient Information 2012 [employer]. 

## 2011-07-07 NOTE — MAU Provider Note (Signed)
Chief Complaint:  Facial Swelling   First Provider Initiated Contact with Patient 07/07/11 1943      HPI  Chelsea Livingston is  36 y.o. G6P5005 at [redacted]w[redacted]d presents with rash and facial swelling since this morning. Burning sensation of face and eyes. 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 Probable poison ivy exposure in last few days. Self treated with OTC Benadryl since 0900 today with total of 6 tabs, last dose 3 hr ago.Treated about noon today at Urgent Care in [place] with IM Kenalog, but unimproved and left eye swollen completely shut. No throat tightness, difficulty swallowing.  Denies contractions, leakage of fluid or vaginal bleeding. Good fetal movement.  PMD Chelsea Livingston  Pregnancy Course: uncomplicated. Hx GDM and passed her 3 hr by report. Has had PTUCs.  Past Medical History: Past Medical History  Diagnosis Date  . Endometriosis   . Normal spontaneous vaginal delivery     5  . GDM (gestational diabetes mellitus)     #3  . Gestational diabetes   . Pregnancy induced hypertension     #2  . Ovarian cyst   . Endometriosis     Past Surgical History: Past Surgical History  Procedure Date  . Laparoscopy 1996  . Laparoscopic ovarian cystectomy     left  . Laparoscopic salpingoopherectomy     with ablation. LSO    Family History: Family History  Problem Relation Age of Onset  . Hypertension Father   . Bone cancer Father   . Diabetes Paternal Grandmother   . Anesthesia problems Neg Hx     Social History: History  Substance Use Topics  . Smoking status: Never Smoker   . Smokeless tobacco: Not on file  . Alcohol Use: No    Allergies:  Allergies  Allergen Reactions  . Codeine Hives and Nausea And Vomiting    Meds:  Prescriptions prior to admission  Medication Sig Dispense Refill  . calcium carbonate (TUMS - DOSED IN MG ELEMENTAL CALCIUM) 500 MG chewable tablet Chew 1 tablet by mouth daily. For indigestion      .  diphenhydrAMINE (BENADRYL) 25 MG tablet Take 25 mg by mouth every 6 (six) hours as needed.      . Prenatal Vit-Fe Fumarate-FA (PRENATAL MULTIVITAMIN) TABS Take 1 tablet by mouth at bedtime.          Physical Exam  Blood pressure 134/80, pulse 83, temperature 98.5 F (36.9 C), temperature source Oral, resp. rate 16, height 5\' 7"  (1.702 m), SpO2 100.00%. GENERAL: Well-developed, well-nourished female in no acute distress.  SKIN: 3 ruptured vesicular lesions on erythematous base left volar forearm. Face covered with edematous red confluent plaques and eyes swollen nearly shut HEENT: normocephalic, good dentition HEART: normal rate RESP: normal effort ABDOMEN: Soft, nontender, nondistended, gravid.  EXTREMITIES: Nontender, no edema NEURO: alert and oriented   FHT:  Baseline 130 , moderate variability, accelerations present, no decelerations Contractions: occ mild   Labs: CBG 105    Assessment: G6P5 at [redacted]w[redacted]d Systemic allergic reaction to poison ivy   Plan:  C/W Chelsea Livingston who advises calling ED MD. Chelsea Livingston at [facility] suggests Prednisoe taper and Pepcid. Will give first dose in ED.  F/U in office next week   Chelsea Livingston 5/11/20137:52 PM

## 2011-08-08 LAB — OB RESULTS CONSOLE GBS: GBS: NEGATIVE

## 2011-08-23 ENCOUNTER — Encounter (HOSPITAL_COMMUNITY): Payer: Self-pay | Admitting: Anesthesiology

## 2011-08-23 ENCOUNTER — Inpatient Hospital Stay (HOSPITAL_COMMUNITY): Payer: Medicaid Other | Admitting: Anesthesiology

## 2011-08-23 ENCOUNTER — Inpatient Hospital Stay (HOSPITAL_COMMUNITY)
Admission: AD | Admit: 2011-08-23 | Discharge: 2011-08-25 | DRG: 775 | Disposition: A | Discharge status: Home / Self Care | Payer: Medicaid Other | Source: Ambulatory Visit | Attending: Obstetrics and Gynecology | Admitting: Obstetrics and Gynecology

## 2011-08-23 ENCOUNTER — Encounter (HOSPITAL_COMMUNITY): Payer: Self-pay | Admitting: *Deleted

## 2011-08-23 DIAGNOSIS — O09529 Supervision of elderly multigravida, unspecified trimester: Secondary | ICD-10-CM | POA: Diagnosis present

## 2011-08-23 DIAGNOSIS — Z3490 Encounter for supervision of normal pregnancy, unspecified, unspecified trimester: Secondary | ICD-10-CM

## 2011-08-23 LAB — URINALYSIS, ROUTINE W REFLEX MICROSCOPIC
Bilirubin Urine: NEGATIVE
Leukocytes, UA: NEGATIVE
Nitrite: NEGATIVE
Protein, ur: NEGATIVE mg/dL
Urobilinogen, UA: 0.2 mg/dL (ref 0.0–1.0)
pH: 6.5 (ref 5.0–8.0)

## 2011-08-23 LAB — TYPE AND SCREEN
ABO/RH(D): A POS
Antibody Screen: NEGATIVE

## 2011-08-23 LAB — CBC
HCT: 34.5 % — ABNORMAL LOW (ref 36.0–46.0)
Hemoglobin: 11.6 g/dL — ABNORMAL LOW (ref 12.0–15.0)
WBC: 12.9 10*3/uL — ABNORMAL HIGH (ref 4.0–10.5)

## 2011-08-23 MED ORDER — LIDOCAINE HCL (PF) 1 % IJ SOLN: 30.0000 mL | INTRAMUSCULAR | Status: DC | PRN

## 2011-08-23 MED ORDER — LACTATED RINGERS IV SOLN: 500.0000 mL | INTRAVENOUS | Status: DC | PRN

## 2011-08-23 MED ORDER — LACTATED RINGERS IV SOLN
INTRAVENOUS | Status: DC
  Administered 2011-08-23 – 2011-08-24 (×3): via INTRAVENOUS

## 2011-08-23 MED ORDER — ONDANSETRON HCL 4 MG/2ML IJ SOLN: 4.0000 mg | Freq: Four times a day (QID) | INTRAMUSCULAR | Status: DC | PRN

## 2011-08-23 MED ORDER — DIPHENHYDRAMINE HCL 50 MG/ML IJ SOLN: 12.5000 mg | INTRAMUSCULAR | Status: DC | PRN

## 2011-08-23 MED ORDER — LACTATED RINGERS IV SOLN: 500.0000 mL | Freq: Once | INTRAVENOUS | Status: DC

## 2011-08-23 MED ORDER — SODIUM BICARBONATE 8.4 % IV SOLN
INTRAVENOUS | Status: DC | PRN
  Administered 2011-08-23: 4 mL via EPIDURAL

## 2011-08-23 MED ORDER — OXYTOCIN BOLUS FROM INFUSION
250.0000 mL | Freq: Once | INTRAVENOUS | Status: DC
  Filled 2011-08-23: qty 500

## 2011-08-23 MED ORDER — PHENYLEPHRINE 40 MCG/ML (10ML) SYRINGE FOR IV PUSH (FOR BLOOD PRESSURE SUPPORT)
80.0000 ug | PREFILLED_SYRINGE | INTRAVENOUS | Status: DC | PRN
  Filled 2011-08-23: qty 5

## 2011-08-23 MED ORDER — FENTANYL 2.5 MCG/ML BUPIVACAINE 1/10 % EPIDURAL INFUSION (WH - ANES)
INTRAMUSCULAR | Status: DC | PRN
  Administered 2011-08-23: 14 mL/h via EPIDURAL

## 2011-08-23 MED ORDER — EPHEDRINE 5 MG/ML INJ
10.0000 mg | INTRAVENOUS | Status: DC | PRN
  Filled 2011-08-23: qty 4

## 2011-08-23 MED ORDER — IBUPROFEN 600 MG PO TABS: 600.0000 mg | ORAL_TABLET | Freq: Four times a day (QID) | ORAL | Status: DC | PRN

## 2011-08-23 MED ORDER — FENTANYL 2.5 MCG/ML BUPIVACAINE 1/10 % EPIDURAL INFUSION (WH - ANES)
14.0000 mL/h | INTRAMUSCULAR | Status: DC
  Administered 2011-08-24 (×4): 14 mL/h via EPIDURAL
  Filled 2011-08-23 (×5): qty 60

## 2011-08-23 MED ORDER — ACETAMINOPHEN 325 MG PO TABS: 650.0000 mg | ORAL_TABLET | ORAL | Status: DC | PRN

## 2011-08-23 MED ORDER — EPHEDRINE 5 MG/ML INJ: 10.0000 mg | INTRAVENOUS | Status: DC | PRN

## 2011-08-23 MED ORDER — PHENYLEPHRINE 40 MCG/ML (10ML) SYRINGE FOR IV PUSH (FOR BLOOD PRESSURE SUPPORT): 80.0000 ug | PREFILLED_SYRINGE | INTRAVENOUS | Status: DC | PRN

## 2011-08-23 MED ORDER — FLEET ENEMA 7-19 GM/118ML RE ENEM: 1.0000 | ENEMA | RECTAL | Status: DC | PRN

## 2011-08-23 MED ORDER — OXYTOCIN 40 UNITS IN LACTATED RINGERS INFUSION - SIMPLE MED
62.5000 mL/h | Freq: Once | INTRAVENOUS | Status: AC
  Administered 2011-08-24: 62.5 mL/h via INTRAVENOUS

## 2011-08-23 MED ORDER — CITRIC ACID-SODIUM CITRATE 334-500 MG/5ML PO SOLN: 30.0000 mL | ORAL | Status: DC | PRN

## 2011-08-23 NOTE — MAU Note (Signed)
Pt has been having U/C since last night.  Contractions are coming every 4-5 min, more intense, and more painful.  Good fetal movement.  Denies vaginal bleeding or ROM.

## 2011-08-23 NOTE — Anesthesia Preprocedure Evaluation (Signed)

## 2011-08-23 NOTE — H&P (Signed)
36 y.o. [redacted]w[redacted]d  G6P5005 comes in c/o ctx.  Otherwise has good fetal movement and no bleeding.  Past Medical History  Diagnosis Date  . Endometriosis   . Normal spontaneous vaginal delivery     5  . GDM (gestational diabetes mellitus)     #3  . Gestational diabetes   . Pregnancy induced hypertension     #2  . Ovarian cyst   . Endometriosis     Past Surgical History  Procedure Date  . Laparoscopy 1996  . Laparoscopic ovarian cystectomy     left  . Laparoscopic salpingoopherectomy     with ablation. LSO    OB History    Grav Para Term Preterm Abortions TAB SAB Ect Mult Living   6 5 5  0 0 0 0 0 0 5     # Outc Date GA Lbr Len/2nd Wgt Sex Del Anes PTL Lv   1 TRM     M SVD EPI No Yes   2 TRM     M SVD EPI No Yes   Comments: elevated BP at end   3 TRM     M SVD EPI No Yes   Comments: gest diab, diet controlled   4 TRM     M SVD EPI No Yes   5 TRM     M SVD EPI No Yes   6 CUR               History   Social History  . Marital Status: Married    Spouse Name: N/A    Number of Children: N/A  . Years of Education: N/A   Occupational History  . Not on file.   Social History Main Topics  . Smoking status: Never Smoker   . Smokeless tobacco: Never Used  . Alcohol Use: No  . Drug Use: No  . Sexually Active: Yes    Birth Control/ Protection: None   Other Topics Concern  . Not on file   Social History Narrative  . No narrative on file   Codeine   Prenatal Course:  IV/PO steroids in May for poison ivy, h/o shoulder dystocia 1st baby, 10# baby @ 37wk for second baby, current pregnancy with [place] 3 weeks ago est. 7#  Filed Vitals:   08/23/11 2122  BP: 128/76  Pulse: 76  Temp: 98.3 F (36.8 C)  Resp: 20     Lungs/Cor:  NAD Abdomen:  soft, gravid Ex:  no cords, erythema SVE:  4/80/-2 FHTs:  130, good STV, NST R Toco:  q2-3   A/P  Admit for labor GBS Neg per pt Routine care  [place], Luther Parody

## 2011-08-23 NOTE — Anesthesia Procedure Notes (Signed)

## 2011-08-24 ENCOUNTER — Encounter (HOSPITAL_COMMUNITY): Payer: Self-pay | Admitting: *Deleted

## 2011-08-24 LAB — RPR: RPR Ser Ql: NONREACTIVE

## 2011-08-24 MED ORDER — ZOLPIDEM TARTRATE 5 MG PO TABS: 5.0000 mg | ORAL_TABLET | Freq: Every evening | ORAL | Status: DC | PRN

## 2011-08-24 MED ORDER — SIMETHICONE 80 MG PO CHEW: 80.0000 mg | CHEWABLE_TABLET | ORAL | Status: DC | PRN

## 2011-08-24 MED ORDER — BENZOCAINE-MENTHOL 20-0.5 % EX AERO: 1.0000 "application " | INHALATION_SPRAY | CUTANEOUS | Status: DC | PRN

## 2011-08-24 MED ORDER — OXYCODONE-ACETAMINOPHEN 5-325 MG PO TABS
1.0000 | ORAL_TABLET | ORAL | Status: DC | PRN
  Administered 2011-08-25: 1 via ORAL
  Filled 2011-08-24: qty 1

## 2011-08-24 MED ORDER — ONDANSETRON HCL 4 MG/2ML IJ SOLN: 4.0000 mg | INTRAMUSCULAR | Status: DC | PRN

## 2011-08-24 MED ORDER — OXYTOCIN 40 UNITS IN LACTATED RINGERS INFUSION - SIMPLE MED
1.0000 m[IU]/min | INTRAVENOUS | Status: DC
  Administered 2011-08-24: 666 m[IU]/min via INTRAVENOUS
  Filled 2011-08-24: qty 1000

## 2011-08-24 MED ORDER — DIPHENHYDRAMINE HCL 25 MG PO CAPS: 25.0000 mg | ORAL_CAPSULE | Freq: Four times a day (QID) | ORAL | Status: DC | PRN

## 2011-08-24 MED ORDER — LANOLIN HYDROUS EX OINT: TOPICAL_OINTMENT | CUTANEOUS | Status: DC | PRN

## 2011-08-24 MED ORDER — SENNOSIDES-DOCUSATE SODIUM 8.6-50 MG PO TABS
2.0000 | ORAL_TABLET | Freq: Every day | ORAL | Status: DC
  Administered 2011-08-24: 2 via ORAL

## 2011-08-24 MED ORDER — LACTATED RINGERS IV SOLN: INTRAVENOUS | Status: DC

## 2011-08-24 MED ORDER — MISOPROSTOL 200 MCG PO TABS
ORAL_TABLET | ORAL | Status: AC
  Administered 2011-08-24: 600 ug via VAGINAL
  Filled 2011-08-24: qty 3

## 2011-08-24 MED ORDER — ONDANSETRON HCL 4 MG PO TABS: 4.0000 mg | ORAL_TABLET | ORAL | Status: DC | PRN

## 2011-08-24 MED ORDER — WITCH HAZEL-GLYCERIN EX PADS: 1.0000 "application " | MEDICATED_PAD | CUTANEOUS | Status: DC | PRN

## 2011-08-24 MED ORDER — DIBUCAINE 1 % RE OINT: 1.0000 "application " | TOPICAL_OINTMENT | RECTAL | Status: DC | PRN

## 2011-08-24 MED ORDER — TERBUTALINE SULFATE 1 MG/ML IJ SOLN: 0.2500 mg | Freq: Once | INTRAMUSCULAR | Status: DC | PRN

## 2011-08-24 MED ORDER — IBUPROFEN 600 MG PO TABS
600.0000 mg | ORAL_TABLET | Freq: Four times a day (QID) | ORAL | Status: DC
  Administered 2011-08-24 – 2011-08-25 (×3): 600 mg via ORAL
  Filled 2011-08-24 (×2): qty 1

## 2011-08-24 MED ORDER — TETANUS-DIPHTH-ACELL PERTUSSIS 5-2.5-18.5 LF-MCG/0.5 IM SUSP: 0.5000 mL | Freq: Once | INTRAMUSCULAR | Status: DC

## 2011-08-24 MED ORDER — MISOPROSTOL 200 MCG PO TABS
600.0000 ug | ORAL_TABLET | Freq: Once | ORAL | Status: AC
  Administered 2011-08-24: 600 ug via VAGINAL

## 2011-08-24 MED ORDER — PRENATAL MULTIVITAMIN CH
1.0000 | ORAL_TABLET | Freq: Every day | ORAL | Status: DC
  Administered 2011-08-25: 1 via ORAL
  Filled 2011-08-24: qty 1

## 2011-08-24 NOTE — Progress Notes (Signed)
Cervix 5/80.  Pitocin at 18 mIU/min.  Will place IUPC.

## 2011-08-24 NOTE — Progress Notes (Signed)
Patient progressed rapidly to complete dilatation.  Will not place IUPC.

## 2011-08-24 NOTE — Progress Notes (Signed)
Patient received epidural last night.  Cervix still 4/80.  AROM and pitocin augmentation.

## 2011-08-24 NOTE — Progress Notes (Signed)
Pt verbalizes concern that labor has stalled.  She does not want to be sent home.  Discussed nipple stim as a method to increase ucs

## 2011-08-25 LAB — CBC
HCT: 33.3 % — ABNORMAL LOW (ref 36.0–46.0)
MCH: 25.8 pg — ABNORMAL LOW (ref 26.0–34.0)
MCHC: 32.7 g/dL (ref 30.0–36.0)
RDW: 15.2 % (ref 11.5–15.5)

## 2011-08-25 NOTE — Discharge Summary (Signed)
Obstetric Discharge Summary Reason for Admission: onset of labor Prenatal Procedures: ultrasound Intrapartum Procedures: spontaneous vaginal delivery Postpartum Procedures: none Complications-Operative and Postpartum: none Hemoglobin  Date Value Range Status  08/25/2011 10.9* 12.0 - 15.0 g/dL Final     HCT  Date Value Range Status  08/25/2011 33.3* 36.0 - 46.0 % Final    Physical Exam:  General: alert Lochia: appropriate Uterine Fundus: firm  Discharge Diagnoses: Term Pregnancy-delivered  Discharge Information: Date: 08/25/2011 Activity: pelvic rest Diet: routine Medications: PNV and Ibuprofen Condition: improved Instructions: refer to practice specific booklet Discharge to: home Follow-up Information    Follow up with Mickel Baas, MD in 4 weeks.   Contact information:   [address] [place] [address] [phone]          Newborn Data: Live born female  Birth Weight: 7 lb 10.9 oz (3485 g) APGAR: 8, 9  Home with mother.  Dudley Mages D 08/25/2011, 5:48 PM

## 2011-08-25 NOTE — Progress Notes (Signed)
Post Partum Day 1 Subjective: no complaints  Objective: Blood pressure 122/83, pulse 81, temperature 98.3 F (36.8 C), temperature source Oral, resp. rate 18, height 5\' 7"  (1.702 m), weight 195 lb 9.6 oz (88.724 kg), SpO2 98.00%, unknown if currently breastfeeding.  Physical Exam:  General: alert Lochia: appropriate Uterine Fundus: firm   Basename 08/25/11 0539 08/23/11 2200  HGB 10.9* 11.6*  HCT 33.3* 34.5*    Assessment/Plan: Plan for discharge tomorrow   LOS: 2 days   Demetris Capell D 08/25/2011, 8:36 AM

## 2011-08-25 NOTE — Discharge Instructions (Signed)
Vaginal Delivery Care After  Change your pad on each trip to the bathroom.   Wipe gently with toilet paper during your hospital stay. Always wipe from front to back. A spray bottle with warm tap water could also be used or a towelette if available.   Place your soiled pad and toilet paper in a bathroom wastebasket with a plastic bag liner.   During your hospital stay, save any clots. If you pass a clot while on the toilet, do not flush it. Also, if your vaginal flow seems excessive to you, notify nursing personnel.   The first time you get out of bed after delivery, wait for assistance from a nurse. Do not get up alone at any time if you feel weak or dizzy.   Bend and extend your ankles forcefully so that you feel the calves of your legs get hard. Do this 6 times every hour when you are in bed and awake.   Do not sit with one foot under you, dangle your legs over the edge of the bed, or maintain a position that hinders the circulation in your legs.   Many women experience after pains for 2 to 3 days after delivery. These after pains are mild uterine contractions. Ask the nurse for a pain medication if you need something for this. Sometimes breastfeeding stimulates after pains; if you find this to be true, ask for the medication  -  hour before the next feeding.   For you and your infant's protection, do not go beyond the door(s) of the obstetric unit. Do not carry your baby in your arms in the hallway. When taking your baby to and from your room, put your baby in the bassinet and push the bassinet.   Mothers may have their babies in their room as much as they desire.  Document Released: 02/10/2000 Document Revised: 02/01/2011 Document Reviewed: 01/10/2007 ExitCare Patient Information 2012 [employer]. 

## 2011-08-27 NOTE — Progress Notes (Signed)
Post discharge chart review completed.  

## 2011-10-06 ENCOUNTER — Encounter (HOSPITAL_COMMUNITY): Payer: Self-pay | Admitting: Pharmacy Technician

## 2011-10-08 ENCOUNTER — Inpatient Hospital Stay (HOSPITAL_COMMUNITY): Admission: RE | Admit: 2011-10-08 | Payer: Medicaid Other | Source: Ambulatory Visit

## 2011-10-10 ENCOUNTER — Encounter (HOSPITAL_COMMUNITY)
Admission: RE | Admit: 2011-10-10 | Discharge: 2011-10-10 | Disposition: A | Discharge status: Home / Self Care | Payer: Medicaid Other | Source: Ambulatory Visit | Attending: Obstetrics & Gynecology | Admitting: Obstetrics & Gynecology

## 2011-10-10 DIAGNOSIS — Z01812 Encounter for preprocedural laboratory examination: Secondary | ICD-10-CM | POA: Insufficient documentation

## 2011-10-10 DIAGNOSIS — Z01818 Encounter for other preprocedural examination: Secondary | ICD-10-CM | POA: Insufficient documentation

## 2011-10-10 LAB — SURGICAL PCR SCREEN: Staphylococcus aureus: NEGATIVE

## 2011-10-10 NOTE — Pre-Procedure Instructions (Signed)
Pt recently taking Phentermine, MD office notified Misty Stanley) that surgery will need to be rescheduled per Dr Malen Gauze. Patient aware.

## 2011-10-25 NOTE — H&P (Signed)
Chelsea Livingston is an 36 y.o. female. She has 6 children and presents for laparoscopic tubal sterilization for voluntary sterilization.  Pertinent Gynecological History: OB History: G6, P6   Menstrual History: LMP:       October 18, 2011  Past Medical History  Diagnosis Date  . Endometriosis   . Normal spontaneous vaginal delivery     5  . GDM (gestational diabetes mellitus)     #3  . Gestational diabetes   . Pregnancy induced hypertension     #2  . Ovarian cyst   . Endometriosis     Past Surgical History  Procedure Date  . Laparoscopy 1996  . Laparoscopic ovarian cystectomy     left  . Laparoscopic salpingoopherectomy     with ablation. LSO    Family History  Problem Relation Age of Onset  . Hypertension Father   . Bone cancer Father   . Diabetes Paternal Grandmother   . Anesthesia problems Neg Hx   . Other Neg Hx     Social History:  reports that she has never smoked. She has never used smokeless tobacco. She reports that she does not drink alcohol or use illicit drugs.  Allergies:  Allergies  Allergen Reactions  . Codeine Hives and Nausea And Vomiting    No prescriptions prior to admission    ROS  not currently breastfeeding. Physical Exam Afebrile, VSS  Abdomen soft  Bimanual pelvic exam was without pathology  Assessment/Plan: Voluntary sterilization.  Plan cautery of right tube (s/p LSO).  Mickel Baas 10/25/2011, 9:44 PM

## 2011-10-26 ENCOUNTER — Encounter (HOSPITAL_COMMUNITY)
Admission: RE | Disposition: A | Discharge status: Home / Self Care | Payer: Self-pay | Source: Ambulatory Visit | Attending: Obstetrics & Gynecology

## 2011-10-26 ENCOUNTER — Encounter (HOSPITAL_COMMUNITY): Payer: Self-pay | Admitting: Anesthesiology

## 2011-10-26 ENCOUNTER — Ambulatory Visit (HOSPITAL_COMMUNITY)
Admission: RE | Admit: 2011-10-26 | Discharge: 2011-10-26 | Disposition: A | Discharge status: Home / Self Care | Payer: Medicaid Other | Source: Ambulatory Visit | Attending: Obstetrics & Gynecology | Admitting: Obstetrics & Gynecology

## 2011-10-26 ENCOUNTER — Ambulatory Visit (HOSPITAL_COMMUNITY): Payer: Medicaid Other | Admitting: Anesthesiology

## 2011-10-26 DIAGNOSIS — Z302 Encounter for sterilization: Secondary | ICD-10-CM | POA: Insufficient documentation

## 2011-10-26 DIAGNOSIS — Z01818 Encounter for other preprocedural examination: Secondary | ICD-10-CM | POA: Insufficient documentation

## 2011-10-26 DIAGNOSIS — Z01812 Encounter for preprocedural laboratory examination: Secondary | ICD-10-CM | POA: Insufficient documentation

## 2011-10-26 HISTORY — PX: LAPAROSCOPIC TUBAL LIGATION: SHX1937

## 2011-10-26 LAB — CBC
HCT: 42.4 % (ref 36.0–46.0)
MCH: 25.1 pg — ABNORMAL LOW (ref 26.0–34.0)
MCHC: 31.8 g/dL (ref 30.0–36.0)
RDW: 14.8 % (ref 11.5–15.5)

## 2011-10-26 LAB — PREGNANCY, URINE: Preg Test, Ur: NEGATIVE

## 2011-10-26 SURGERY — LIGATION, FALLOPIAN TUBE, LAPAROSCOPIC
Anesthesia: General | Site: Abdomen | Laterality: Right | Wound class: Clean Contaminated

## 2011-10-26 MED ORDER — ONDANSETRON HCL 4 MG/2ML IJ SOLN
INTRAMUSCULAR | Status: DC | PRN
  Administered 2011-10-26: 4 mg via INTRAVENOUS

## 2011-10-26 MED ORDER — ATROPINE SULFATE 0.4 MG/ML IJ SOLN
INTRAMUSCULAR | Status: AC
  Filled 2011-10-26: qty 1

## 2011-10-26 MED ORDER — DEXAMETHASONE SODIUM PHOSPHATE 10 MG/ML IJ SOLN
INTRAMUSCULAR | Status: AC
  Filled 2011-10-26: qty 1

## 2011-10-26 MED ORDER — KETOROLAC TROMETHAMINE 30 MG/ML IJ SOLN
INTRAMUSCULAR | Status: DC | PRN
  Administered 2011-10-26: 30 mg via INTRAVENOUS

## 2011-10-26 MED ORDER — ATROPINE SULFATE 0.4 MG/ML IJ SOLN
INTRAMUSCULAR | Status: DC | PRN
  Administered 2011-10-26: 0.2 mg via INTRAVENOUS

## 2011-10-26 MED ORDER — KETOROLAC TROMETHAMINE 30 MG/ML IJ SOLN
INTRAMUSCULAR | Status: AC
  Filled 2011-10-26: qty 1

## 2011-10-26 MED ORDER — LIDOCAINE HCL (CARDIAC) 20 MG/ML IV SOLN
INTRAVENOUS | Status: AC
  Filled 2011-10-26: qty 5

## 2011-10-26 MED ORDER — FENTANYL CITRATE 0.05 MG/ML IJ SOLN
INTRAMUSCULAR | Status: AC
  Filled 2011-10-26: qty 2

## 2011-10-26 MED ORDER — KETOROLAC TROMETHAMINE 30 MG/ML IJ SOLN: 15.0000 mg | Freq: Once | INTRAMUSCULAR | Status: DC | PRN

## 2011-10-26 MED ORDER — PROPOFOL 10 MG/ML IV EMUL: INTRAVENOUS | Status: DC | PRN

## 2011-10-26 MED ORDER — FENTANYL CITRATE 0.05 MG/ML IJ SOLN
INTRAMUSCULAR | Status: DC | PRN
  Administered 2011-10-26 (×4): 50 ug via INTRAVENOUS

## 2011-10-26 MED ORDER — ROCURONIUM BROMIDE 100 MG/10ML IV SOLN
INTRAVENOUS | Status: DC | PRN
  Administered 2011-10-26: 30 mg via INTRAVENOUS

## 2011-10-26 MED ORDER — LIDOCAINE HCL (CARDIAC) 20 MG/ML IV SOLN
INTRAVENOUS | Status: DC | PRN
  Administered 2011-10-26: 50 mg via INTRAVENOUS
  Administered 2011-10-26: 30 mg via INTRAVENOUS

## 2011-10-26 MED ORDER — DEXAMETHASONE SODIUM PHOSPHATE 4 MG/ML IJ SOLN
INTRAMUSCULAR | Status: DC | PRN
  Administered 2011-10-26: 10 mg via INTRAVENOUS

## 2011-10-26 MED ORDER — ROCURONIUM BROMIDE 100 MG/10ML IV SOLN: INTRAVENOUS | Status: DC | PRN

## 2011-10-26 MED ORDER — GLYCOPYRROLATE 0.2 MG/ML IJ SOLN
INTRAMUSCULAR | Status: AC
  Filled 2011-10-26: qty 1

## 2011-10-26 MED ORDER — MIDAZOLAM HCL 5 MG/5ML IJ SOLN
INTRAMUSCULAR | Status: DC | PRN
  Administered 2011-10-26: 1 mg via INTRAVENOUS

## 2011-10-26 MED ORDER — GLYCOPYRROLATE 0.2 MG/ML IJ SOLN
INTRAMUSCULAR | Status: DC | PRN
  Administered 2011-10-26: 0.6 mg via INTRAVENOUS

## 2011-10-26 MED ORDER — MIDAZOLAM HCL 2 MG/2ML IJ SOLN
INTRAMUSCULAR | Status: AC
  Filled 2011-10-26: qty 2

## 2011-10-26 MED ORDER — NEOSTIGMINE METHYLSULFATE 1 MG/ML IJ SOLN
INTRAMUSCULAR | Status: AC
  Filled 2011-10-26: qty 10

## 2011-10-26 MED ORDER — LACTATED RINGERS IV SOLN
INTRAVENOUS | Status: DC
  Administered 2011-10-26 (×2): via INTRAVENOUS

## 2011-10-26 MED ORDER — NEOSTIGMINE METHYLSULFATE 1 MG/ML IJ SOLN
INTRAMUSCULAR | Status: DC | PRN
  Administered 2011-10-26: 3 mg via INTRAVENOUS

## 2011-10-26 MED ORDER — PROPOFOL 10 MG/ML IV EMUL
INTRAVENOUS | Status: DC | PRN
  Administered 2011-10-26: 150 mg via INTRAVENOUS

## 2011-10-26 MED ORDER — PROPOFOL 10 MG/ML IV EMUL
INTRAVENOUS | Status: AC
  Filled 2011-10-26: qty 20

## 2011-10-26 MED ORDER — ONDANSETRON HCL 4 MG/2ML IJ SOLN
INTRAMUSCULAR | Status: AC
  Filled 2011-10-26: qty 2

## 2011-10-26 MED ORDER — FENTANYL CITRATE 0.05 MG/ML IJ SOLN: 25.0000 ug | INTRAMUSCULAR | Status: DC | PRN

## 2011-10-26 SURGICAL SUPPLY — 16 items
ADH SKN CLS APL DERMABOND .7 (GAUZE/BANDAGES/DRESSINGS) ×1
CATH ROBINSON RED A/P 16FR (CATHETERS) ×2 IMPLANT
CLOTH BEACON ORANGE TIMEOUT ST (SAFETY) ×2 IMPLANT
DERMABOND ADVANCED (GAUZE/BANDAGES/DRESSINGS) ×1
DERMABOND ADVANCED .7 DNX12 (GAUZE/BANDAGES/DRESSINGS) ×1 IMPLANT
GLOVE ECLIPSE 6.0 STRL STRAW (GLOVE) ×2 IMPLANT
GLOVE ECLIPSE 6.5 STRL STRAW (GLOVE) ×2 IMPLANT
GOWN PREVENTION PLUS LG XLONG (DISPOSABLE) ×4 IMPLANT
NDL SPNL 20GX3.5 QUINCKE YW (NEEDLE) IMPLANT
NEEDLE SPNL 20GX3.5 QUINCKE YW (NEEDLE) IMPLANT
PACK LAPAROSCOPY BASIN (CUSTOM PROCEDURE TRAY) ×2 IMPLANT
STRIP CLOSURE SKIN 1/4X3 (GAUZE/BANDAGES/DRESSINGS) ×2 IMPLANT
SUT VICRYL 4-0 PS2 18IN ABS (SUTURE) ×1 IMPLANT
TOWEL OR 17X24 6PK STRL BLUE (TOWEL DISPOSABLE) ×4 IMPLANT
WARMER LAPAROSCOPE (MISCELLANEOUS) ×2 IMPLANT
WATER STERILE IRR 1000ML POUR (IV SOLUTION) ×2 IMPLANT

## 2011-10-26 NOTE — Anesthesia Procedure Notes (Signed)
Procedure Name: Intubation Date/Time: 10/26/2011 12:16 PM Performed by: Isabella Bowens R Pre-anesthesia Checklist: Patient identified, Emergency Drugs available, Suction available, Patient being monitored and Timeout performed Patient Re-evaluated:Patient Re-evaluated prior to inductionOxygen Delivery Method: Circle system utilized Preoxygenation: Pre-oxygenation with 100% oxygen Intubation Type: IV induction Ventilation: Mask ventilation without difficulty Laryngoscope Size: Miller and 3 Grade View: Grade I Tube type: Oral Tube size: 7.0 mm Number of attempts: 1 Airway Equipment and Method: Stylet Placement Confirmation: ETT inserted through vocal cords under direct vision,  positive ETCO2 and breath sounds checked- equal and bilateral Secured at: 21 cm Tube secured with: Tape Dental Injury: Teeth and Oropharynx as per pre-operative assessment  Difficulty Due To: Difficulty was unanticipated

## 2011-10-26 NOTE — Progress Notes (Signed)
I have interviewed and performed the pertinent exams on my patient to confirm that there have been no significant changes in her condition since the dictation of her history and physical exam.  

## 2011-10-26 NOTE — Transfer of Care (Signed)
Immediate Anesthesia Transfer of Care Note  Patient: Chelsea Livingston  Procedure(s) Performed: Procedure(s) (LRB): LAPAROSCOPIC TUBAL LIGATION (Right)  Patient Location: PACU  Anesthesia Type: General  Level of Consciousness: awake, alert  and oriented  Airway & Oxygen Therapy: Patient Spontanous Breathing and Patient connected to nasal cannula oxygen  Post-op Assessment: Report given to PACU RN and Post -op Vital signs reviewed and stable  Post vital signs: Reviewed and stable  Complications: No apparent anesthesia complications

## 2011-10-26 NOTE — Op Note (Signed)
Patient Name: Chelsea Livingston MRN: [id number]  Date of Surgery: 10/26/2011    PREOPERATIVE DIAGNOSIS: DESIRES STERILIZATION  POSTOPERATIVE DIAGNOSIS: DESIRES STERILIZATION   PROCEDURE: Laparoscopy with cautery of the right tube for sterilization  SURGEON: Caralyn Guile. Arlyce Dice M.D.  ANESTHESIA: General endotracheal  ESTIMATED BLOOD LOSS: minimal  FINDINGS: Surgically absent left tube and ovary, normal right adnexa, no pelvic pathology or residual endometriosis noted.   COMPLICATIONS: None  INDICATIONS: Voluntary Sterilization  PROCEDURE IN DETAIL:  The abdomen, vagina, and perineum were prepped and draped in sterile fashion.  The bladder was catheterized.  A Hulka tenaculum was placed into the endometrial canal and fixed to the anterior lip of the cervix.  The surgeon re- gowned and gloved.  An incision was made at the umbilicus and a Veress needle was placed and a pneumoperitoneum was created.  A 5 mm umbilical port was placed and the 5 mm laparoscope was introduced.  Uunder direct visualization a 5 mm port was placed in the suprapubic area for the Kleppinger forceps.   The pelvis was viewed and the right tube was identified and cauterized along a 4 cm length leaving 1 cm of normal tube proximally.  The pneumoperitoneum was released, and the instruments and ports were removed.  The abdominal incisions were closed with Dermabond.   All sponge and instrument counts were correct.  The patient tolerated the procedure well and left the operating room in good condition.  Iam Lipson D. Arlyce Dice, M.D.

## 2011-10-26 NOTE — Anesthesia Preprocedure Evaluation (Signed)
Anesthesia Evaluation  Patient identified by MRN, date of birth, ID band Patient awake    Reviewed: Allergy & Precautions, H&P , NPO status , Patient's Chart, lab work & pertinent test results, reviewed documented beta blocker date and time   History of Anesthesia Complications Negative for: history of anesthetic complications  Airway Mallampati: II TM Distance: >3 FB Neck ROM: full  Mouth opening: Limited Mouth Opening  Dental  (+) Teeth Intact   Pulmonary neg pulmonary ROS,  breath sounds clear to auscultation  Pulmonary exam normal       Cardiovascular Exercise Tolerance: Good negative cardio ROS  Rhythm:regular Rate:Normal     Neuro/Psych negative neurological ROS  negative psych ROS   GI/Hepatic negative GI ROS, Neg liver ROS,   Endo/Other  negative endocrine ROS  Renal/GU negative Renal ROS  Female GU complaint  negative genitourinary   Musculoskeletal   Abdominal   Peds  Hematology negative hematology ROS (+)   Anesthesia Other Findings Stopped phentermine 3 weeks ago  Reproductive/Obstetrics negative OB ROS                           Anesthesia Physical Anesthesia Plan  ASA: I  Anesthesia Plan: General ETT   Post-op Pain Management:    Induction:   Airway Management Planned:   Additional Equipment:   Intra-op Plan:   Post-operative Plan:   Informed Consent: I have reviewed the patients History and Physical, chart, labs and discussed the procedure including the risks, benefits and alternatives for the proposed anesthesia with the patient or authorized representative who has indicated his/her understanding and acceptance.   Dental Advisory Given  Plan Discussed with: CRNA and Surgeon  Anesthesia Plan Comments:         Anesthesia Quick Evaluation

## 2011-10-29 ENCOUNTER — Encounter (HOSPITAL_COMMUNITY): Payer: Self-pay | Admitting: Obstetrics & Gynecology

## 2011-11-03 NOTE — Anesthesia Postprocedure Evaluation (Signed)
Anesthesia Post Note  Patient: Chelsea Livingston  Procedure(s) Performed: Procedure(s) (LRB): LAPAROSCOPIC TUBAL LIGATION (Right)  Anesthesia type: General  Patient location: PACU  Post pain: Pain level controlled  Post assessment: Post-op Vital signs reviewed  Last Vitals:  Filed Vitals:   10/26/11 1430  BP:   Pulse: 63  Temp:   Resp: 18    Post vital signs: Reviewed  Level of consciousness: sedated  Complications: No apparent anesthesia complications

## 2012-03-04 IMAGING — MG MM DIGITAL SCREENING BILAT W/ CAD
4 series · 4 of 4 positions shown · non-contrast
Comparison: none

[R CC]
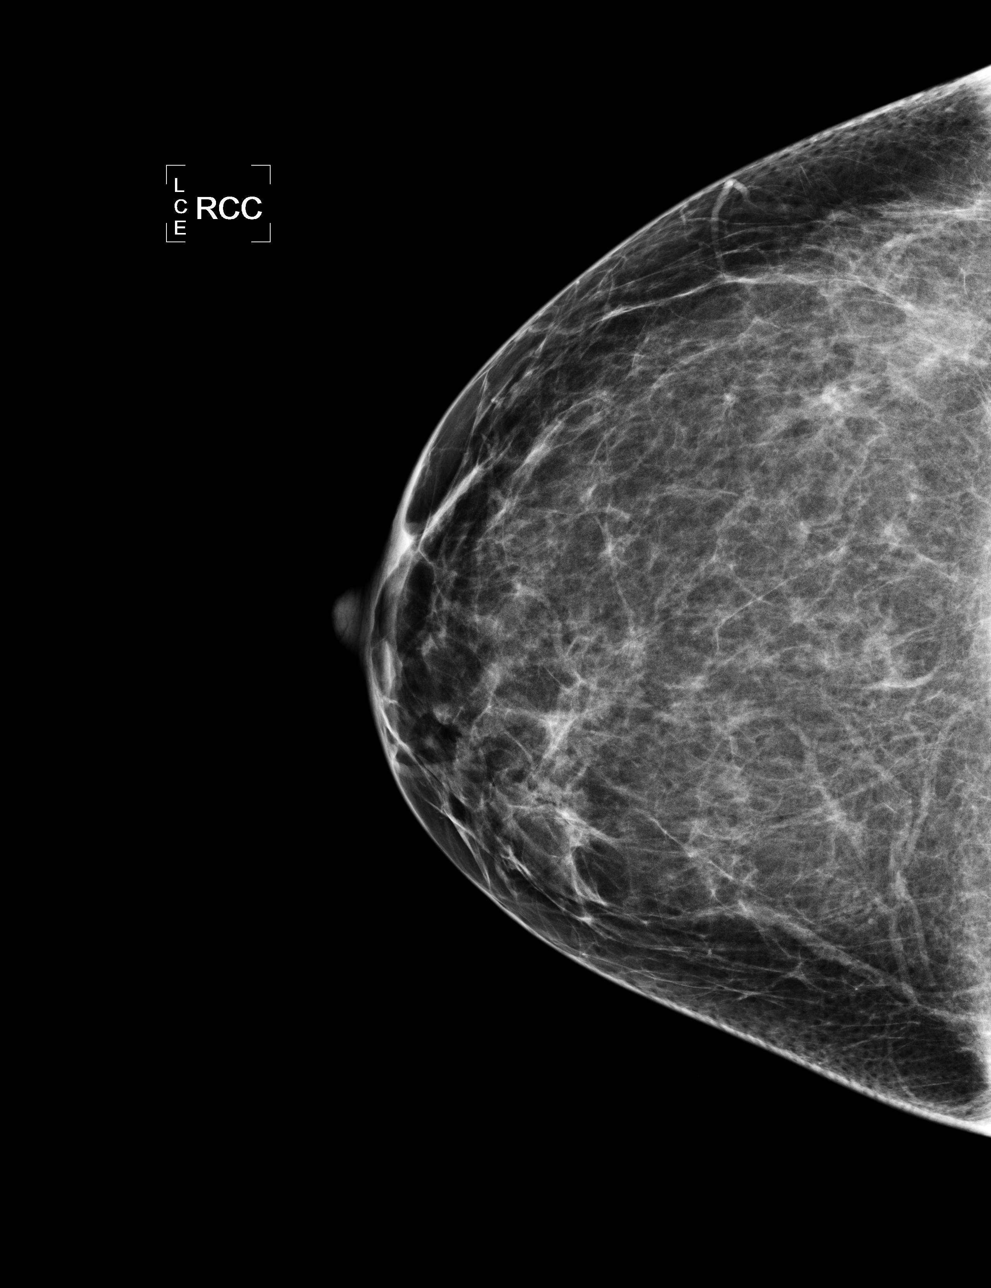

[L CC]
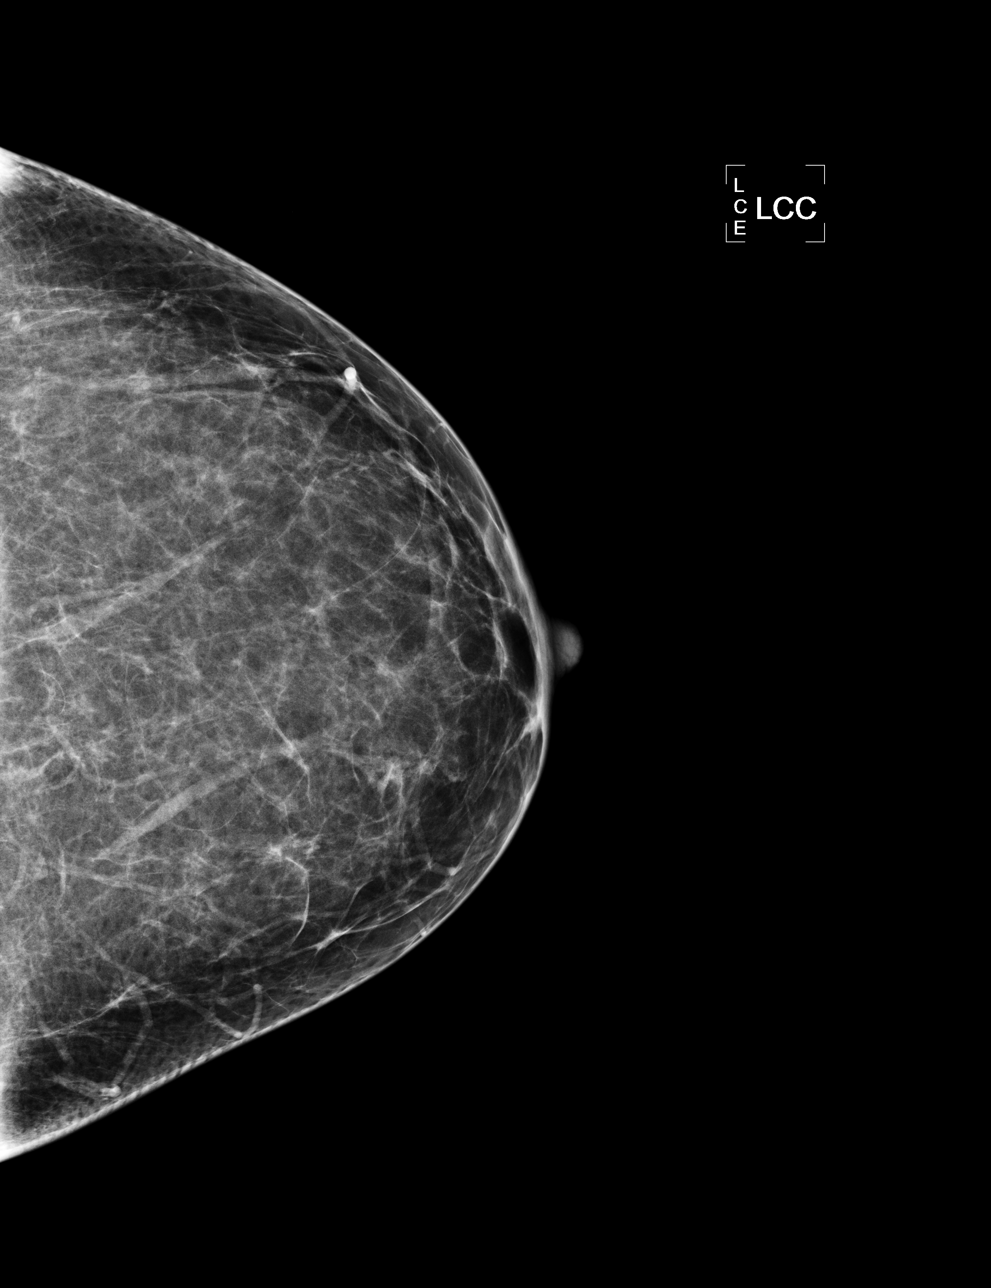

[L MLO]
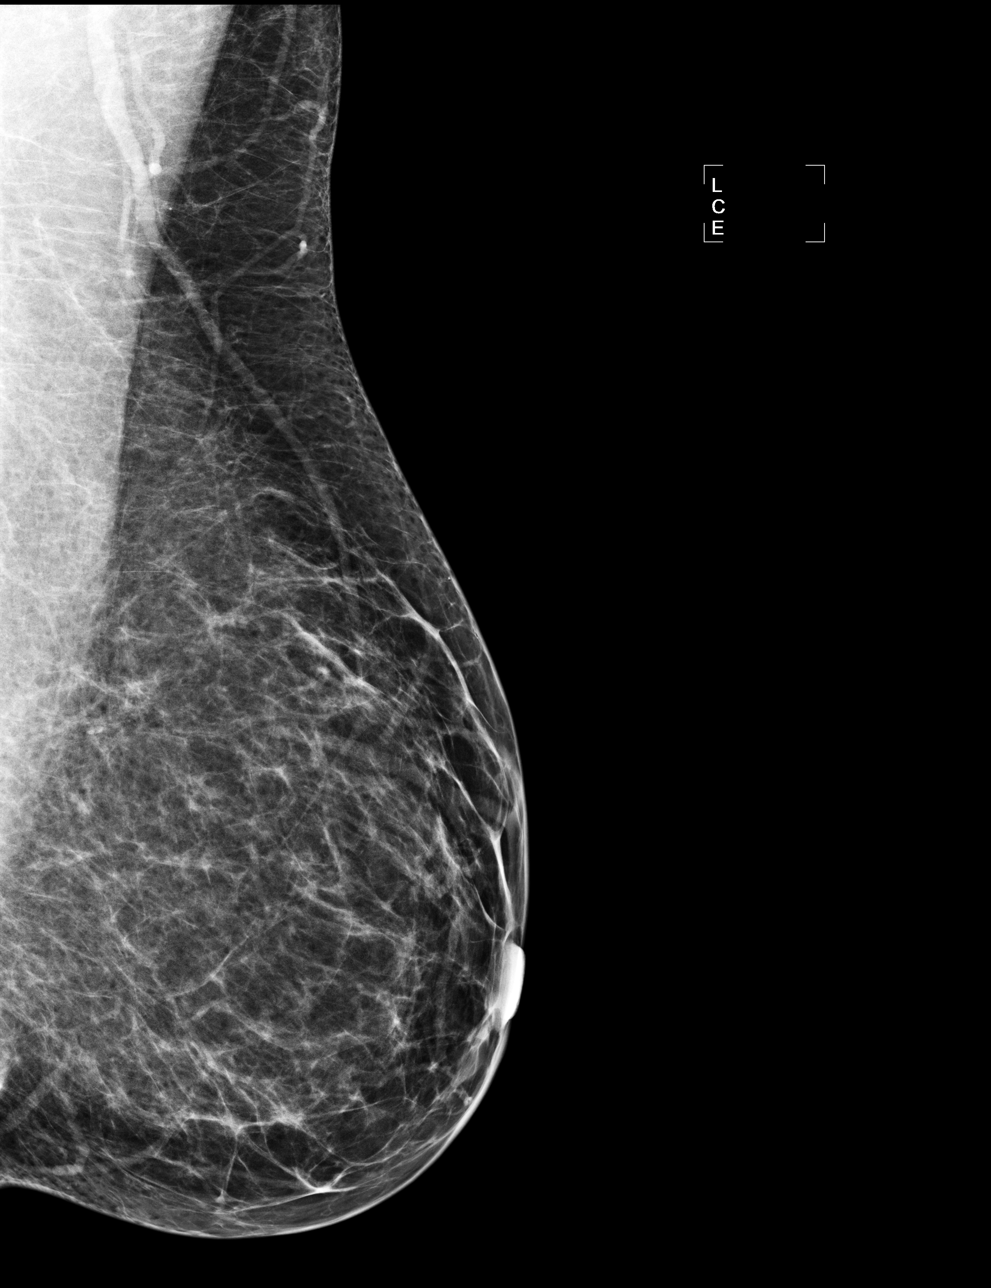

[R MLO]
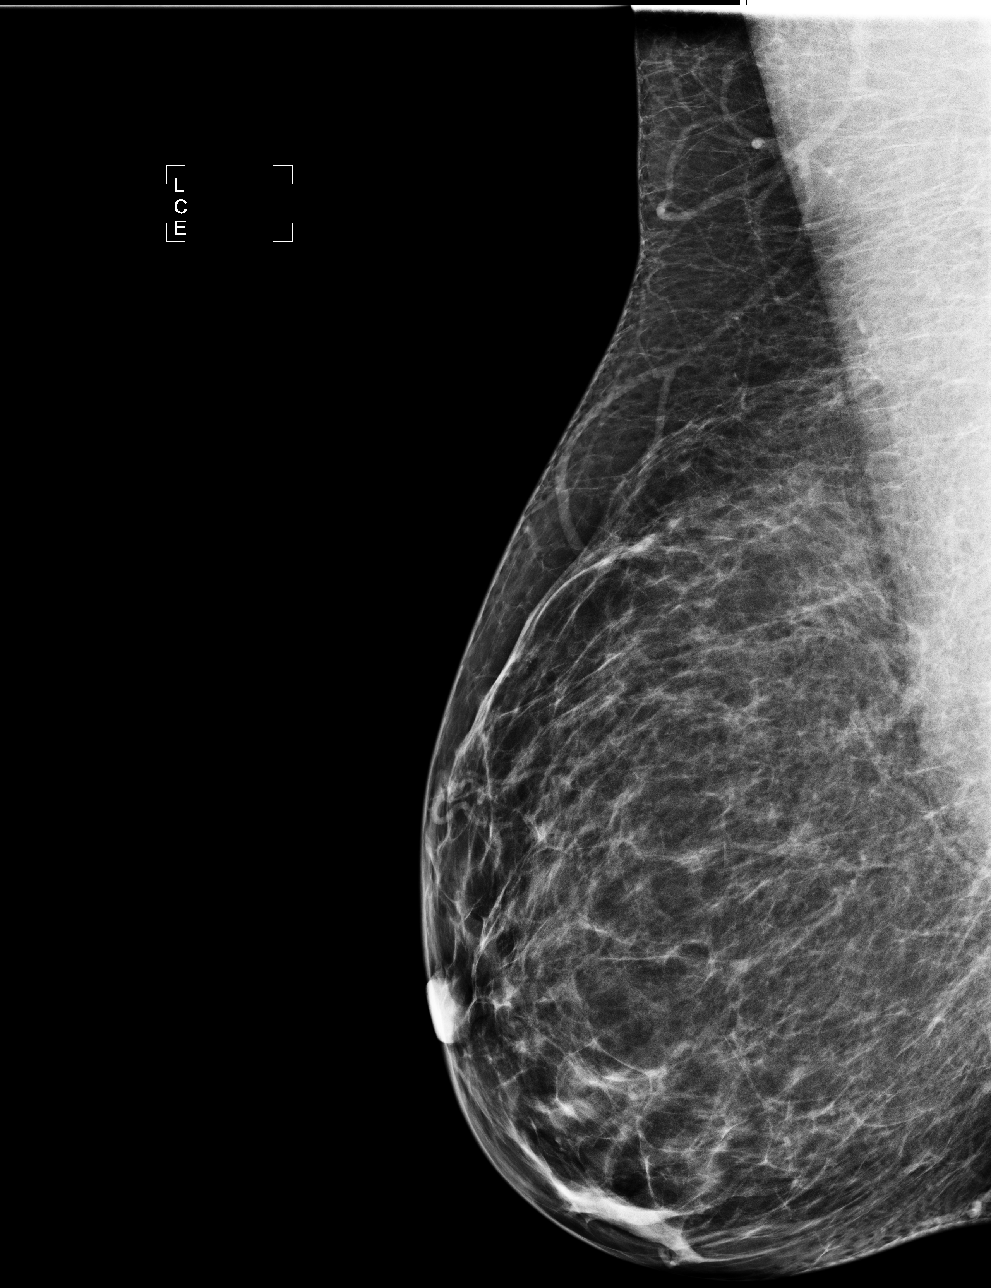

[4 of 4 positions shown; findings below may reference images not displayed]

Canned report from images found in remote index.

Refer to host system for actual result text.

## 2012-03-27 ENCOUNTER — Ambulatory Visit (INDEPENDENT_AMBULATORY_CARE_PROVIDER_SITE_OTHER): Payer: Self-pay | Admitting: Women's Health

## 2012-03-27 ENCOUNTER — Encounter: Payer: Self-pay | Admitting: Women's Health

## 2012-03-27 DIAGNOSIS — Z9851 Tubal ligation status: Secondary | ICD-10-CM

## 2012-03-27 DIAGNOSIS — N809 Endometriosis, unspecified: Secondary | ICD-10-CM | POA: Insufficient documentation

## 2012-03-27 DIAGNOSIS — R102 Pelvic and perineal pain: Secondary | ICD-10-CM

## 2012-03-27 DIAGNOSIS — N912 Amenorrhea, unspecified: Secondary | ICD-10-CM

## 2012-03-27 DIAGNOSIS — Z86010 Personal history of colonic polyps: Secondary | ICD-10-CM

## 2012-03-27 MED ORDER — MEDROXYPROGESTERONE ACETATE 10 MG PO TABS: 10.0000 mg | ORAL_TABLET | Freq: Every day | ORAL | Status: DC

## 2012-03-27 NOTE — Patient Instructions (Addendum)
Secondary Amenorrhea   Secondary amenorrhea is the stopping of menstrual flow for 3 to 6 months in a female who has previously had periods. There are many possible causes. Most of these causes are not serious. Usually treating the underlying problem causing the loss of menses will return your periods to normal.  CAUSES   Some common and uncommon causes of not menstruating include:  · Malnutrition.  · Low blood sugar (hypoglycemia).  · Polycystic ovarian disease.  · Stress or fear.  · Breastfeeding.  · Hormone imbalance.  · Ovarian failure.  · Medications.  · Extreme obesity.  · Cystic fibrosis.  · Low body weight or drastic weight reduction from any cause.  · Early menopause.  · Removal of ovaries or uterus.  · Contraceptives.  · Illness.  · Long term (chronic) illnesses.  · Cushing's syndrome.  · Thyroid problems.  · Birth control pills, patches, or vaginal rings for birth control.  DIAGNOSIS   This diagnosis is made by your caregiver taking a medical history and doing a physical exam. Pregnancy must be ruled out. Often times, numerous blood tests of different hormones in the body may be measured. Urine testing may be done. Specialized x-rays may have to be done as well as measuring the body mass index (BMI).  TREATMENT   Treatment depends on the cause of the amenorrhea. If an eating disorder is present, this can be treated with an adequate diet and therapy. Chronic illnesses may improve with treatment of the illness. Overall, the outlook is good. The amenorrhea may be corrected with medications, lifestyle changes, or surgery. If the amenorrhea cannot be corrected, it is sometimes possible to create a false menstruation with medications.  Document Released: 03/26/2006 Document Revised: 05/07/2011 Document Reviewed: 01/31/2007  ExitCare® Patient Information ©2013 [employer].

## 2012-03-27 NOTE — Progress Notes (Signed)
Patient ID: Chelsea Livingston, female   DOB: [birth year], 37 y.o.   MRN: [id number] Presents with amenorrhea and left lower quadrant pain for 3 weeks. Pain radiates to lower back. Minimal relief with Motrin Last cycle was in November, BTL in August and had normal cycles September October, November.  Had a normal vaginal delivery June 2013, [name], bottlefeeding, has 5 boys ages 73 through 17. History of regular monthly cycles when not pregnant. History of endometriosis, LSO. Denies urinary symptoms, vaginal discharge, changes in bowel elimination or fever. History of precancerous polyps April 2012, scheduled for repeat colonoscopy April 2014.  Exam: Appears uncomfortable, U PT: negative. UA: Negative. Abdomen soft no rebound or radiation of pain, external genitalia within normal limits, speculum exam no discharge, odor or erythema. Bimanual no CMT, uterus small, tenderness on left lower quadrant.  Amenorrhea Left lower quadrant pain/LSO History of endometriosis History of precancerous colon polyp 2012  Plan: Ultrasound, will schedule. Provera 10 mg by mouth daily for 5 days, prescription, proper use given. Instructed to call if no cycle after completing Provera. Also discussed  HER option ablation, handout given.

## 2012-03-28 ENCOUNTER — Ambulatory Visit (INDEPENDENT_AMBULATORY_CARE_PROVIDER_SITE_OTHER): Payer: Self-pay

## 2012-03-28 ENCOUNTER — Ambulatory Visit (INDEPENDENT_AMBULATORY_CARE_PROVIDER_SITE_OTHER): Payer: Self-pay | Admitting: Women's Health

## 2012-03-28 ENCOUNTER — Encounter: Payer: Self-pay | Admitting: Women's Health

## 2012-03-28 ENCOUNTER — Encounter: Payer: Self-pay | Admitting: Gynecology

## 2012-03-28 DIAGNOSIS — N949 Unspecified condition associated with female genital organs and menstrual cycle: Secondary | ICD-10-CM

## 2012-03-28 DIAGNOSIS — R52 Pain, unspecified: Secondary | ICD-10-CM

## 2012-03-28 DIAGNOSIS — R102 Pelvic and perineal pain: Secondary | ICD-10-CM

## 2012-03-28 MED ORDER — HYDROCODONE-ACETAMINOPHEN 5-500 MG PO TABS: 1.0000 | ORAL_TABLET | Freq: Four times a day (QID) | ORAL | Status: AC | PRN

## 2012-03-28 NOTE — Progress Notes (Signed)
Patient ID: Chelsea Livingston, female   DOB: 04-09-1975, 37 y.o.   MRN: [id number] Presents for ultrasound for left lower quadrant pain. Was seen in the office yesterday for amenorrhea and LLQ pain that has persisted for 3 weeks. History of endometriosis, LSO, tubal ligation 09/2011, history of precancerous colon polyp April 2012. U PT negative.  Ultrasound: No uterine abnormalities noted. Right ovary appears normal with the 21 mm follicle. Left adnexa normal. No free fluid seen.  Left lower quadrant pain History of endometriosis  Plan: Reviewed probably not GYN related, although could be endometriosis pain. Followup with GI Dr. Elnoria Howard for colonoscopy. Due for colonoscopy April 2014 we'll get scheduled earlier. Prescription for Vicodin 5/500 to be use sparingly given. Aware of addictive in constipating properties.

## 2012-05-19 ENCOUNTER — Telehealth: Payer: Self-pay | Admitting: Women's Health

## 2012-05-19 NOTE — Telephone Encounter (Signed)
Left message to call in regards to consult with Dr. Elnoria Howard at [facility]. History of colon polyp 2012, having lower left quadrant pain and history of endometriosis. Colonoscopy was not recommended until 2017. Instructed to call in regards to pain.

## 2012-09-18 ENCOUNTER — Encounter: Payer: Self-pay | Admitting: Women's Health

## 2012-09-18 ENCOUNTER — Ambulatory Visit (INDEPENDENT_AMBULATORY_CARE_PROVIDER_SITE_OTHER): Payer: BC Managed Care – PPO | Admitting: Women's Health

## 2012-09-18 DIAGNOSIS — N926 Irregular menstruation, unspecified: Secondary | ICD-10-CM

## 2012-09-18 NOTE — Patient Instructions (Addendum)
1200 Calorie Diabetic Diet The 1200 calorie diabetic diet limits calories to 1200 each day. Following this diet and making healthy meal choices can help improve overall health. It controls blood glucose (sugar) levels and can also help lower blood pressure and cholesterol.  SERVING SIZES Measuring foods and serving sizes helps to make sure you are getting the right amount of food. The list below tells how big or small some common serving sizes are.   1 oz.........4 stacked dice.  3 oz........Marland KitchenDeck of cards.  1 tsp.......Marland KitchenTip of little finger.  1 tbs......Marland KitchenMarland KitchenThumb.  2 tbs.......Marland KitchenGolf ball.   cup......Marland KitchenHalf of a fist.  1 cup.......Marland KitchenA fist. GUIDELINES FOR CHOOSING FOODS The goal of this diet is to eat a variety of foods and limit calories to 1200 each day. This can be done by choosing foods that are low in calories and fat. The diet also suggests eating small amounts of food frequently. Doing this helps control your blood glucose levels, so they do not get too high or too low. Each meal or snack may include a protein food source to help you feel more satisfied. Try to eat about the same amount of food around the same time each day. This includes weekend days, travel days, and days off work. Space your meals about 4 to 5 hours apart, and add a snack between them, if you wish.  For example, a daily food plan could include breakfast, a morning snack, lunch, dinner, and an evening snack. Healthy meals and snacks have different types of foods, including whole grains, vegetables, fruits, lean meats, poultry, fish, and dairy products. As you plan your meals, select a variety of foods. Choose from the bread and starch, vegetable, fruit, dairy, and meat/protein groups. Examples of foods from each group are listed below, with their suggested serving sizes. Use measuring cups and spoons to become familiar with what a healthy portion looks like. Bread and Starch Each serving equals 15 grams of  carbohydrate.  1 slice bread.   bagel.   cup cold cereal (unsweetened).   cup hot cereal or mashed potatoes.  1 small potato (size of a computer mouse).   cup cooked pasta or rice.   English muffin.  1 cup broth-based soup.  3 cups of popcorn.  4 to 6 whole-wheat crackers.   cup cooked beans, peas, or corn. Vegetables Each serving equals 5 grams of carbohydrate.   cup cooked vegetables.  1 cup raw vegetables.   cup tomato or vegetable juice. Fruit Each serving equals 15 grams of carbohydrate.  1 small apple or orange.  1  cup watermelon or strawberries.   cup applesauce (no sugar added).  2 tbs raisins.   banana.   cup canned fruit, packed in water or in its own juice.   cup unsweetened fruit juice. Dairy Each serving equals 12 to 15 grams of carbohydrate.  1 cup fat-free milk.  6 oz artificially sweetened yogurt or plain yogurt.  1 cup low-fat buttermilk.  1 cup soy milk.  1 cup almond milk. Meat/Protein  1 large egg.  2 to 3 oz meat, poultry, or fish.   cup low-fat cottage cheese.  1 tbs peanut butter.  1 oz low-fat cheese.   cup tuna, packed in water.   cup tofu. Fat  1 tsp oil.  1 tsp trans-fat-free margarine.  1 tsp butter.  1 tsp mayonnaise.  2 tbs avocado.  1 tbs salad dressing.  1 tbs cream cheese.  2 tbs sour cream. SAMPLE 1200 CALORIE  DIET PLAN Breakfast  1 cup fat-free milk (1 carb serving).  1 small orange (1 carb serving).  1 scrambled egg.  1 slice whole-wheat toast (1 carb serving). Lunch  Sandwich  2 slices whole-wheat bread (2 carb servings).  2 oz lean meat.  2 tsp reduced fat mayonnaise.  1 lettuce leaf.  2 slices tomato.  1 cup carrot sticks. Afternoon Snack  1 small apple (1 carb serving).  1 string cheese. Dinner  2 oz meat.  1 small baked potato (1 carb serving).  1 tsp trans-fat-free margarine.  1 cup steamed broccoli.  1 cup fat-free milk (1 carb  serving). Evening Snack   small banana (1 carb serving).  6 vanilla wafers (1 carb serving). MEAL PLAN You can use this worksheet to help you make a daily meal plan based on the 1200 calorie diabetic diet suggestions. If you are using this plan to help you control your blood glucose, you may interchange carbohydrate containing foods (dairy, starches, and fruits). Select a variety of fresh foods of varying colors and flavors. The total amount of carbohydrate in your meals or snacks is more important than making sure you include all of the food groups every time you eat. You can choose from approximately this many of the following foods to build your day's meals:  6 Starches.  3 Vegetables.  2 Fruits.  2 Dairy.  4 to 6 oz Meat/Protein.  Up to 3 Fats. Your dietician can use this worksheet to help you decide how many servings and which types of foods are right for you. BREAKFAST Food Group and Servings / Food Choice Starch _________________________________________________________ Dairy __________________________________________________________ Fruit ___________________________________________________________ Meat/Protein____________________________________________________ Fat ____________________________________________________________ LUNCH Food Group and Servings / Food Choice  Starch _________________________________________________________ Meat/Protein ___________________________________________________ Vegetables _____________________________________________________ Fruit __________________________________________________________ Dairy __________________________________________________________ Fat ____________________________________________________________ Aura Fey Food Group and Servings / Food Choice Dairy __________________________________________________________ Fruit ___________________________________________________________ Starch  __________________________________________________________ Meat/Protein____________________________________________________ Laural Golden Food Group and Servings / Food Choice Starch _________________________________________________________ Meat/Protein ___________________________________________________ Dairy __________________________________________________________ Vegetables _____________________________________________________ Fruit __________________________________________________________ Fat ____________________________________________________________ Lollie Sails Food Group and Servings / Food Choice Fruit ___________________________________________________________ Meat/Protein ____________________________________________________ Dairy __________________________________________________________ Starch __________________________________________________________ DAILY TOTALS Starches _________________________ Vegetables _______________________ Fruits ____________________________ Dairy ____________________________ Meat/Protein______________________ Fats _____________________________ Document Released: 09/04/2004 Document Revised: 05/07/2011 Document Reviewed: 12/30/2008 ExitCare Patient Information 2014 [employer]. Hives Hives are itchy, red, swollen areas of the skin. They can vary in size and location on your body. Hives can come and go for hours or several days (acute hives) or for several weeks (chronic hives). Hives do not spread from person to person (noncontagious). They may get worse with scratching, exercise, and emotional stress. CAUSES   Allergic reaction to food, additives, or drugs.  Infections, including the common cold.  Illness, such as vasculitis, lupus, or thyroid disease.  Exposure to sunlight, heat, or cold.  Exercise.  Stress.  Contact with chemicals. SYMPTOMS   Red or white swollen patches on the skin. The patches may change size, shape, and  location quickly and repeatedly.  Itching.  Swelling of the hands, feet, and face. This may occur if hives develop deeper in the skin. DIAGNOSIS  Your caregiver can usually tell what is wrong by performing a physical exam. Skin or blood tests may also be done to determine the cause of your hives. In some cases, the cause cannot be determined. TREATMENT  Mild cases usually get better with medicines such as antihistamines. Severe cases may require an emergency epinephrine injection. If the cause of your hives is known, treatment includes avoiding  that trigger.  HOME CARE INSTRUCTIONS   Avoid causes that trigger your hives.  Take antihistamines as directed by your caregiver to reduce the severity of your hives. Non-sedating or low-sedating antihistamines are usually recommended. Do not drive while taking an antihistamine.  Take any other medicines prescribed for itching as directed by your caregiver.  Wear loose-fitting clothing.  Keep all follow-up appointments as directed by your caregiver. SEEK MEDICAL CARE IF:   You have persistent or severe itching that is not relieved with medicine.  You have painful or swollen joints. SEEK IMMEDIATE MEDICAL CARE IF:   You have a fever.  Your tongue or lips are swollen.  You have trouble breathing or swallowing.  You feel tightness in the throat or chest.  You have abdominal pain. These problems may be the first sign of a life-threatening allergic reaction. Call your local emergency services (911 in [place] MAKE SURE YOU:   Understand these instructions.  Will watch your condition.  Will get help right away if you are not doing well or get worse. Document Released: 02/12/2005 Document Revised: 08/14/2011 Document Reviewed: 05/08/2011 [facility] Patient Information 2014 [place], [place].

## 2012-09-18 NOTE — Progress Notes (Signed)
Patient ID: Chelsea Livingston, female   DOB: 1975/07/29, 37 y.o.   MRN: [id number] Presents with complaint of 20-30 pound weight gain in the past 2 months without changing eating habits or activity level. Also reports cycles have become more irregular every 14-28 days for past 2-3 months. History of monthly cycles. BTL. Also having left lower back pain radiating down buttocks, increased in the a.m., increased pain with position changes. Has had increased stress in her life, she is in school and has 5 children. Her parents had a failed business with bankruptcy, loss of home and business.   Exam: Appears stressed. Left lower back/hip pain with palpation. Full mobility. External genitalia within normal limits, speculum exam no visible discharge, no erythema noted. Bimanual uterus is small, nontender no adnexal fullness or tenderness. States does have slight tenderness on the left side but has had for years/endometriosis.  Left back/hip pain Irregular cycles Reported weight gain  Plan: TSH, prolactin.  Continue to have active lifestyle, decrease calories. Discussed followup with orthopedist to evaluate left back/hip pain. Keep menstrual calendar, instructed to call if cycles are less than 21 days.

## 2012-09-19 LAB — PROLACTIN: Prolactin: 10.7 ng/mL

## 2013-02-05 ENCOUNTER — Ambulatory Visit (INDEPENDENT_AMBULATORY_CARE_PROVIDER_SITE_OTHER): Payer: BC Managed Care – PPO

## 2013-02-05 ENCOUNTER — Encounter: Payer: Self-pay | Admitting: Women's Health

## 2013-02-05 ENCOUNTER — Other Ambulatory Visit: Payer: Self-pay | Admitting: Women's Health

## 2013-02-05 ENCOUNTER — Ambulatory Visit (INDEPENDENT_AMBULATORY_CARE_PROVIDER_SITE_OTHER): Payer: BC Managed Care – PPO | Admitting: Women's Health

## 2013-02-05 DIAGNOSIS — N8300 Follicular cyst of ovary, unspecified side: Secondary | ICD-10-CM

## 2013-02-05 DIAGNOSIS — N949 Unspecified condition associated with female genital organs and menstrual cycle: Secondary | ICD-10-CM

## 2013-02-05 DIAGNOSIS — N912 Amenorrhea, unspecified: Secondary | ICD-10-CM

## 2013-02-05 DIAGNOSIS — R109 Unspecified abdominal pain: Secondary | ICD-10-CM

## 2013-02-05 DIAGNOSIS — N83209 Unspecified ovarian cyst, unspecified side: Secondary | ICD-10-CM

## 2013-02-05 DIAGNOSIS — R102 Pelvic and perineal pain unspecified side: Secondary | ICD-10-CM

## 2013-02-05 DIAGNOSIS — N898 Other specified noninflammatory disorders of vagina: Secondary | ICD-10-CM

## 2013-02-05 LAB — WET PREP FOR TRICH, YEAST, CLUE: Clue Cells Wet Prep HPF POC: NONE SEEN

## 2013-02-05 LAB — PREGNANCY, URINE: Preg Test, Ur: NEGATIVE

## 2013-02-05 MED ORDER — MEDROXYPROGESTERONE ACETATE 10 MG PO TABS: ORAL_TABLET | ORAL | Status: AC

## 2013-02-05 NOTE — Progress Notes (Signed)
Patient ID: KONI KANNAN, female   DOB: 1976/01/07, 36 y.o.   MRN: [id number] Presents with complaint of low abdominal pain, irregular cycles, back  pain especially on left side. Has had irregular cycles this past year with a normal TSH and prolactin July 2014. Normal ultrasound January 2014. History of BTL, endometriosis, LSO for cysts/pain/ adhesions. Has been on Depo-Provera in the past but had continual bleeding, Mirena IUD increased pelvic pain and ovarian cysts. 2009 Lupron, ovarian cysts. Felt best when pregnant. Does not want to take birth control pills. Questions what can be done to help pain. Denies urinary symptoms, vaginal discharge, fever, but states has not felt well.   Exam: Appears uncomfortable. Abdomen soft without rebound generalized discomfort in low abdomen. External genitalia within normal limits, speculum exam scant discharge no erythema, odor noted. Bimanual no CMT or tenderness noted in right adnexa. U PT negative. Ultrasound: Anteverted uterus with sliver of fluid in endometrial cavity, homogeneous myometrium. Right ovary 5 echo-free follicles 14 x 13 mm, 18 x 15 mm, 12 x 12 mm, 20 x 20 mm, 22 x 18 mm with a thin septum. All avascular. Negative cul-de-sac.  Multiple right ovary follicles Persistent pelvic and back pain Endometriosis  Plan: Provera 10 for 5 days, instructed to call if no menses after finishing. Options  -  Lupron, Mirena IUD given and reviewed declines since failed in the past. [facility] pending. Followup with  orthopedist due to back pain. If pelvic pain persists schedule followup with Dr. Lily Peer to discuss possible surgical options.

## 2013-02-09 ENCOUNTER — Telehealth: Payer: Self-pay | Admitting: *Deleted

## 2013-02-09 NOTE — Telephone Encounter (Signed)
(  pt aware you are out of the office) Pt called to follow up from OV 02/05/13 took provera 10 daily x5 days as directed. Pt said no cycle yet, still having some discomfort but not from her back. Pt said internal discomfort which could be from vaginal ultrasound, has Vicodin for pain, has not taken any today due to work. Please advis e

## 2013-02-10 NOTE — Telephone Encounter (Signed)
Please call and review may take up to 2 weeks for cycle after completing provera.

## 2013-02-10 NOTE — Telephone Encounter (Signed)
Left the below on pt voicemail. 

## 2013-04-10 ENCOUNTER — Telehealth: Payer: Self-pay | Admitting: *Deleted

## 2013-04-10 NOTE — Telephone Encounter (Signed)
Pt informed with the below note. 

## 2013-04-10 NOTE — Telephone Encounter (Signed)
Pt calling c/o spotting after sex for several months, also states her cycle started 2 weeks ago and noticed today bright red blood spotting on pad, left side discomfort. Overdue for annual, last seen on 02/05/14 for problem visit. Pt had tubal ligation, her concern is the spotting after intercourse and spotting after having cycle 2 weeks ago. Please advise if OV today okay? Or okay to wait? Pt # if needed [phone]

## 2013-04-10 NOTE — Telephone Encounter (Signed)
Office visit early next week okay.

## 2013-12-28 ENCOUNTER — Encounter: Payer: Self-pay | Admitting: Women's Health

## 2014-05-14 ENCOUNTER — Ambulatory Visit: Payer: Self-pay | Admitting: Women's Health

## 2019-07-31 ENCOUNTER — Institutional Professional Consult (permissible substitution): Payer: Self-pay | Admitting: Plastic Surgery
